# Patient Record
Sex: Female | Born: 1990 | Race: Black or African American | Hispanic: No | Marital: Single | Smoking: Former smoker
Health system: Southern US, Community
[De-identification: ages and names within clinical notes are randomized; demographics above are authoritative.]

## PROBLEM LIST (undated history)

## (undated) ENCOUNTER — Inpatient Hospital Stay (HOSPITAL_COMMUNITY): Payer: Self-pay

## (undated) DIAGNOSIS — Z789 Other specified health status: Secondary | ICD-10-CM

## (undated) HISTORY — PX: DILATION AND CURETTAGE OF UTERUS: SHX78

## (undated) HISTORY — PX: HERNIA REPAIR: SHX51

---

## 2016-03-31 NOTE — L&D Delivery Note (Signed)
Delivery Note At 10:57 PM a viable female was delivered via Vaginal, Spontaneous Delivery (Presentation: Cephalic;ROA  ).  APGAR: 9, 9; weight pending  Placenta status: intact   Cord:3 vessels  with the following complications: none.    Anesthesia:  none Episiotomy: None Lacerations: Left and right labial laceration hemostatic Suture Repair: None needed Est. Blood Loss (mL): 50  Mom to postpartum.  Baby to Couplet care / Skin to Skin.  Head delivered ROA. No nuchal cord present. Shoulder and body delivered in usual fashion. Infant with spontaneous cry, placed on mother's abdomen, dried and bulb suctioned. Cord clamped x 2 after 1-minute delay, and cut by family member. Cord blood drawn. Placenta delivered spontaneously with gentle cord traction. Fundus firm with massage and Pitocin. Perineum inspected and found to have left and right labial lacerations, which was found to be hemostatic.  Teodoro KilKerrriann S. Emmons Toth,  MD Family Medicine Resident PGY-1 01/21/17, 11:18 PM

## 2016-10-11 LAB — OB RESULTS CONSOLE HEPATITIS B SURFACE ANTIGEN: Hepatitis B Surface Ag: NEGATIVE

## 2016-10-11 LAB — OB RESULTS CONSOLE PLATELET COUNT: PLATELETS: 214

## 2016-10-11 LAB — OB RESULTS CONSOLE HGB/HCT, BLOOD
HCT: 34
Hemoglobin: 10.9

## 2016-10-11 LAB — OB RESULTS CONSOLE RPR: RPR: NONREACTIVE

## 2016-10-11 LAB — OB RESULTS CONSOLE RUBELLA ANTIBODY, IGM: Rubella: IMMUNE

## 2016-10-11 LAB — OB RESULTS CONSOLE TSH: TSH: 1.172

## 2016-11-06 ENCOUNTER — Encounter (HOSPITAL_COMMUNITY): Payer: Self-pay | Admitting: Emergency Medicine

## 2016-11-06 ENCOUNTER — Encounter (HOSPITAL_COMMUNITY): Payer: Self-pay | Admitting: *Deleted

## 2016-11-06 ENCOUNTER — Inpatient Hospital Stay (HOSPITAL_COMMUNITY)
Admission: AD | Admit: 2016-11-06 | Discharge: 2016-11-06 | Disposition: A | Discharge status: Home / Self Care | Payer: Self-pay | Source: Ambulatory Visit | Attending: Obstetrics & Gynecology | Admitting: Obstetrics & Gynecology

## 2016-11-06 ENCOUNTER — Emergency Department (HOSPITAL_COMMUNITY)
Admission: EM | Admit: 2016-11-06 | Discharge: 2016-11-06 | Discharge status: Against Medical Advice | Payer: Self-pay | Attending: Emergency Medicine | Admitting: Emergency Medicine

## 2016-11-06 DIAGNOSIS — Z3A29 29 weeks gestation of pregnancy: Secondary | ICD-10-CM | POA: Insufficient documentation

## 2016-11-06 DIAGNOSIS — Z87891 Personal history of nicotine dependence: Secondary | ICD-10-CM | POA: Insufficient documentation

## 2016-11-06 DIAGNOSIS — O9989 Other specified diseases and conditions complicating pregnancy, childbirth and the puerperium: Secondary | ICD-10-CM

## 2016-11-06 DIAGNOSIS — N898 Other specified noninflammatory disorders of vagina: Secondary | ICD-10-CM | POA: Insufficient documentation

## 2016-11-06 DIAGNOSIS — O26893 Other specified pregnancy related conditions, third trimester: Secondary | ICD-10-CM | POA: Insufficient documentation

## 2016-11-06 HISTORY — DX: Other specified health status: Z78.9

## 2016-11-06 LAB — URINALYSIS, ROUTINE W REFLEX MICROSCOPIC
BILIRUBIN URINE: NEGATIVE
Bacteria, UA: NONE SEEN
GLUCOSE, UA: NEGATIVE mg/dL
Hgb urine dipstick: NEGATIVE
KETONES UR: NEGATIVE mg/dL
Nitrite: NEGATIVE
PROTEIN: NEGATIVE mg/dL
Specific Gravity, Urine: 1.025 (ref 1.005–1.030)
pH: 6 (ref 5.0–8.0)

## 2016-11-06 LAB — WET PREP, GENITAL
CLUE CELLS WET PREP: NONE SEEN
Sperm: NONE SEEN
Trich, Wet Prep: NONE SEEN
Yeast Wet Prep HPF POC: NONE SEEN

## 2016-11-06 NOTE — MAU Note (Signed)
Urine in the lab  

## 2016-11-06 NOTE — MAU Note (Signed)
Pt reports having some vaginal swelling and "lumps" that started yesterday. C/O pelvic pain as well.[place] just arrived from [place] yesterday. Stated she was getting prenatal care there last visit was 4 weeks ago[facility]. Reports some white vaginal discharge as well.

## 2016-11-06 NOTE — Discharge Instructions (Signed)
[facility]  ° ° °[facility]       Phone: [phone] ° °[facility] Phone: [phone] ° °[facility]  Phone: [phone] ° °[facility]  Phone: [phone] ° °[facility]  Phone: [phone] ° °[facility]       Phone: [phone] ° °[facility]    Phone: [phone]  ° °[facility]    Phone: [phone] ° °[facility]    Phone: [phone] ° °[facility]    Phone: [phone] ° °[facility]    Phone: [phone] ° °[facility]       Phone: [phone]  ° °[facility]  Phone: [phone] ° °[facility]    Phone: [phone] ° °[facility]   Phone: [phone] ° °[facility]      Phone: [phone] ° °[facility]    Phone: [phone] ° °

## 2016-11-06 NOTE — Progress Notes (Addendum)
G6P5 @ 29.[redacted] wksga. Presents to triage for vaginal pain and swelling for 3 days. Pt moved to [place] yesterday from [place] and plans to stay in the area. Did have PNC. Denies LOF or bleeding. + FM. EFM applied. VSS see flow sheet for details.   All SVD with two preterm births. Pt states had d&c due to "lot of bleeding".   1540: Provider at bs assessing. Wet prep and GC done w pelvic exam  1647: Provider at bs discussing results of tests and going of POC.   Discharge instructions given with pt understanding. Pt left unit via ambulatory.

## 2016-11-06 NOTE — ED Triage Notes (Addendum)
Pt is [redacted] weeks pregnant. Pt reports white discharge for 3 days. PT also reports swelling and lumps to vagina that come and go. Pt also reports lower pelvic cramping. Pt has good fetal movement.  P-7 G-6 Pt is not taking prenatal vitamins. Pt [place] prenatal [place] in [place] but just moved here yesterday so she needs an OB.

## 2016-11-06 NOTE — MAU Provider Note (Signed)
History     CSN: [id number]  Arrival date and time: 11/06/16 1338   First Provider Initiated Contact with Patient 11/06/16 1500      Chief Complaint  Patient presents with  . Groin Swelling  . Vaginal Discharge    HPI: Heather Cohen is a 26 y.o. [phone] with IUP at 2547w2d who presents to maternity admissions reporting vaginal swelling/lumps for about 3 days. Reports noting some bumps on her labia, but no sores and no pain. Reports some lower abdominal pain, but denies contractions. Also notes that she has had a vaginal discharge.   Denies contractions, leakage of fluid or vaginal bleeding. Good fetal movement.  Also denies any  fevers, chills, sweats, dysuria, nausea, vomiting, other GI or GU symptoms or other general symptoms.  [place] [facility] [place] [facility] [place], a reports uncomplicated pregnancy. All previous pregnancies uncomplicated resulting in vaginal deliveries.   Past obstetric history: OB History  Gravida Para Term Preterm AB Living  6 5 3 2   5   SAB TAB Ectopic Multiple Live Births               # Outcome Date GA Lbr Len/2nd Weight Sex Delivery Anes PTL Lv  6 Current           5 Preterm           4 Preterm           3 Term           2 Term           1 Term               Past Medical History:  Diagnosis Date  . Complication of anesthesia   . Medical history non-contributory     Past Surgical History:  Procedure Laterality Date  . DILATION AND CURETTAGE OF UTERUS     2011  . HERNIA REPAIR      History reviewed. No pertinent family history.  Social History  Substance Use Topics  . Smoking status: Former Games developermoker  . Smokeless tobacco: Never Used  . Alcohol use No    Allergies: No Known Allergies  No prescriptions prior to admission.    Review of Systems - Negative except for what is mentioned in HPI.  Physical Exam   Blood pressure (!) 99/54, pulse 68, temperature 98.5 F (36.9 C), temperature source Oral, resp.  rate 18, height 5\' 6"  (1.676 m), weight 155 lb (70.3 kg).  Constitutional: Well-developed, well-nourished female in no acute distress.  Cardiovascular: normal rate, regular rythm Respiratory: normal effort, no respiratory distress  GI: Abd soft, non-tender, gravid appropriate for gestational age.   GU: Neg CVAT.  Pelvic: Mild swelling of left labia, without erythema, increase in warmth or loculated lesion; no ulcers; no TTP. Whiteish vaginal discharge, no blood, cervix clean. No CMT.  MSK: Extremities nontender, no edema, normal ROM Neurologic: Alert and oriented x 4. Psych: Normal mood and affect   FHT:  Baseline 145 , moderate variability, accelerations present, no decelerations Toco: no contractions  MAU Course  Procedures  MDM. Exam as above Wet prep and GC/Chlamydia sent. Wet pre neg for yeast, trich or clue cells.   Assessment and Plan  Assessment: 1. Vaginal discharge    Plan: --Discussed return precautions at length. --Discharge home in stable condition.  --PTL precautions --Given list of OB providers to establish care in the area ASAP.  [name], Kandra NicolasJulie P, MD 11/06/2016 3:52 PM

## 2016-11-10 LAB — GC/CHLAMYDIA PROBE AMP (~~LOC~~) NOT AT ARMC
CHLAMYDIA, DNA PROBE: NEGATIVE
NEISSERIA GONORRHEA: NEGATIVE

## 2016-12-18 ENCOUNTER — Inpatient Hospital Stay (HOSPITAL_COMMUNITY)
Admission: AD | Admit: 2016-12-18 | Discharge: 2016-12-18 | Disposition: A | Discharge status: Home / Self Care | Payer: Self-pay | Source: Ambulatory Visit | Attending: Obstetrics and Gynecology | Admitting: Obstetrics and Gynecology

## 2016-12-18 ENCOUNTER — Encounter (HOSPITAL_COMMUNITY): Payer: Self-pay

## 2016-12-18 DIAGNOSIS — Z87891 Personal history of nicotine dependence: Secondary | ICD-10-CM | POA: Insufficient documentation

## 2016-12-18 DIAGNOSIS — O4703 False labor before 37 completed weeks of gestation, third trimester: Secondary | ICD-10-CM

## 2016-12-18 DIAGNOSIS — Z3A35 35 weeks gestation of pregnancy: Secondary | ICD-10-CM | POA: Insufficient documentation

## 2016-12-18 MED ORDER — PRENATAL VITAMINS 0.8 MG PO TABS: 1.0000 | ORAL_TABLET | Freq: Every day | ORAL | 12 refills | Status: AC

## 2016-12-18 MED ORDER — LACTATED RINGERS IV BOLUS (SEPSIS)
1000.0000 mL | Freq: Once | INTRAVENOUS | Status: DC
Start: 2016-12-18 — End: 2016-12-18

## 2016-12-18 MED ORDER — LACTATED RINGERS IV BOLUS (SEPSIS)
1000.0000 mL | Freq: Once | INTRAVENOUS | Status: AC
  Administered 2016-12-18: 1000 mL via INTRAVENOUS

## 2016-12-18 MED ORDER — BETAMETHASONE SOD PHOS & ACET 6 (3-3) MG/ML IJ SUSP
12.0000 mg | INTRAMUSCULAR | Status: DC
  Administered 2016-12-18: 12 mg via INTRAMUSCULAR
  Filled 2016-12-18 (×2): qty 2

## 2016-12-18 NOTE — Discharge Instructions (Signed)
Return tomorrow around 12:30 to receive your second dose of steroid

## 2016-12-18 NOTE — MAU Provider Note (Signed)
  History     CSN: [id number]  Arrival date and time: 12/18/16 1020   None     Chief Complaint  Patient presents with  . Contractions   HPI 26 yo [id number] at [redacted]w[redacted]d here for the evaluation of contractions since 9:30am. Patient moved from [place] in late August and has not been able to establish care. She has a history of preterm delivery around 35-36 weeks. She reports uncomplicated prenatal care in [place]. She denies HTN or DM. She reports good fetal movement, she denies leakage of fluid or vaginal bleeding. Patient states that since her arrival to the MAU her contractions are not as intense as they were at home.   OB History    Gravida Para Term Preterm AB Living   SAB TAB Ectopic Multiple Live Births                  Past Medical History:  Diagnosis Date  . Medical history non-contributory     Past Surgical History:  Procedure Laterality Date  . DILATION AND CURETTAGE OF UTERUS     2011  . HERNIA REPAIR      History reviewed. No pertinent family history.  Social History  Substance Use Topics  . Smoking status: Former Games developer  . Smokeless tobacco: Never Used  . Alcohol use No    Allergies: No Known Allergies  No prescriptions prior to admission.    Review of Systems  See pertinent in HPI Physical Exam   Blood pressure 117/68, pulse 77, temperature 97.9 F (36.6 C), temperature source Oral, resp. rate 18.  Physical Exam GENERAL: Well-developed, well-nourished female in no acute distress.  ABDOMEN: Soft, nontender, gravid Cervix: 2.5/50/-3 EXTREMITIES: No cyanosis, clubbing, or edema, 2+ distal pulses.  FHT: baseline 140, mod variability, no accels, no decels Toco: ctx q 2 minutes  MAU Course  Procedures  MDM IV hydration Patient reports complete resolution of her contractions following IV hydration  Cervical exam remains unchanged FHT: baseline 140, mod variability, +accels, no decels Toco: no contractions  Assessment and Plan   26 yo [id number] at [redacted]w[redacted]d with preterm contractions - reassurance provided - advised patient to stay well hydrated - BMZ today and tomorrow - Will contact [facility] to schedule appointment to start prenatal care - Labor precautions reviewed  Noemy Hallmon 12/18/2016, 12:35 PM

## 2016-12-18 NOTE — MAU Note (Signed)
Started having contractions and hour ago. Pain was really bad and kept coming.

## 2016-12-26 ENCOUNTER — Encounter: Payer: Self-pay | Admitting: Obstetrics and Gynecology

## 2016-12-26 ENCOUNTER — Ambulatory Visit (INDEPENDENT_AMBULATORY_CARE_PROVIDER_SITE_OTHER): Payer: Self-pay | Admitting: Obstetrics and Gynecology

## 2016-12-26 VITALS — BP 107/67 | HR 61 | Wt 158.6 lb

## 2016-12-26 DIAGNOSIS — R8290 Unspecified abnormal findings in urine: Secondary | ICD-10-CM

## 2016-12-26 DIAGNOSIS — O09899 Supervision of other high risk pregnancies, unspecified trimester: Secondary | ICD-10-CM

## 2016-12-26 DIAGNOSIS — O09213 Supervision of pregnancy with history of pre-term labor, third trimester: Secondary | ICD-10-CM

## 2016-12-26 DIAGNOSIS — O0990 Supervision of high risk pregnancy, unspecified, unspecified trimester: Secondary | ICD-10-CM

## 2016-12-26 DIAGNOSIS — O0993 Supervision of high risk pregnancy, unspecified, third trimester: Secondary | ICD-10-CM

## 2016-12-26 DIAGNOSIS — Z113 Encounter for screening for infections with a predominantly sexual mode of transmission: Secondary | ICD-10-CM

## 2016-12-26 DIAGNOSIS — O09219 Supervision of pregnancy with history of pre-term labor, unspecified trimester: Secondary | ICD-10-CM

## 2016-12-26 DIAGNOSIS — Z331 Pregnant state, incidental: Secondary | ICD-10-CM

## 2016-12-26 LAB — POCT URINALYSIS DIP (DEVICE)
BILIRUBIN URINE: NEGATIVE
GLUCOSE, UA: NEGATIVE mg/dL
Hgb urine dipstick: NEGATIVE
KETONES UR: NEGATIVE mg/dL
Nitrite: POSITIVE — AB
Protein, ur: NEGATIVE mg/dL
SPECIFIC GRAVITY, URINE: 1.025 (ref 1.005–1.030)
Urobilinogen, UA: 1 mg/dL (ref 0.0–1.0)
pH: 6.5 (ref 5.0–8.0)

## 2016-12-26 NOTE — Patient Instructions (Signed)
Vaginal Delivery Vaginal delivery means that you will give birth by pushing your baby out of your birth canal (vagina). A team of health care providers will help you before, during, and after vaginal delivery. Birth experiences are unique for every woman and every pregnancy, and birth experiences vary depending on where you choose to give birth. What should I do to prepare for my baby's birth? Before your baby is born, it is important to talk with your health care provider about:  Your labor and delivery preferences. These may include: ? Medicines that you may be given. ? How you will manage your pain. This might include non-medical pain relief techniques or injectable pain relief such as epidural analgesia. ? How you and your baby will be monitored during labor and delivery. ? Who may be in the labor and delivery room with you. ? Your feelings about surgical delivery of your baby (cesarean delivery, or C-section) if this becomes necessary. ? Your feelings about receiving donated blood through an IV tube (blood transfusion) if this becomes necessary.  Whether you are able: ? To take pictures or videos of the birth. ? To eat during labor and delivery. ? To move around, walk, or change positions during labor and delivery.  What to expect after your baby is born, such as: ? Whether delayed umbilical cord clamping and cutting is offered. ? Who will care for your baby right after birth. ? Medicines or tests that may be recommended for your baby. ? Whether breastfeeding is supported in your hospital or birth center. ? How long you will be in the hospital or birth center.  How any medical conditions you have may affect your baby or your labor and delivery experience.  To prepare for your baby's birth, you should also:  Attend all of your health care visits before delivery (prenatal visits) as recommended by your health care provider. This is important.  Prepare your home for your baby's  arrival. Make sure that you have: ? Diapers. ? Baby clothing. ? Feeding equipment. ? Safe sleeping arrangements for you and your baby.  Install a car seat in your vehicle. Have your car seat checked by a certified car seat installer to make sure that it is installed safely.  Think about who will help you with your new baby at home for at least the first several weeks after delivery.  What can I expect when I arrive at the birth center or hospital? Once you are in labor and have been admitted into the hospital or birth center, your health care provider may:  Review your pregnancy history and any concerns you have.  Insert an IV tube into one of your veins. This is used to give you fluids and medicines.  Check your blood pressure, pulse, temperature, and heart rate (vital signs).  Check whether your bag of water (amniotic sac) has broken (ruptured).  Talk with you about your birth plan and discuss pain control options.  Monitoring Your health care provider may monitor your contractions (uterine monitoring) and your baby's heart rate (fetal monitoring). You may need to be monitored:  Often, but not continuously (intermittently).  All the time or for long periods at a time (continuously). Continuous monitoring may be needed if: ? You are taking certain medicines, such as medicine to relieve pain or make your contractions stronger. ? You have pregnancy or labor complications.  Monitoring may be done by:  Placing a special stethoscope or a handheld monitoring device on your abdomen to   check your baby's heartbeat, and feeling your abdomen for contractions. This method of monitoring does not continuously record your baby's heartbeat or your contractions.  Placing monitors on your abdomen (external monitors) to record your baby's heartbeat and the frequency and length of contractions. You may not have to wear external monitors all the time.  Placing monitors inside of your uterus  (internal monitors) to record your baby's heartbeat and the frequency, length, and strength of your contractions. ? Your health care provider may use internal monitors if he or she needs more information about the strength of your contractions or your baby's heart rate. ? Internal monitors are put in place by passing a thin, flexible wire through your vagina and into your uterus. Depending on the type of monitor, it may remain in your uterus or on your baby's head until birth. ? Your health care provider will discuss the benefits and risks of internal monitoring with you and will ask for your permission before inserting the monitors.  Telemetry. This is a type of continuous monitoring that can be done with external or internal monitors. Instead of having to stay in bed, you are able to move around during telemetry. Ask your health care provider if telemetry is an option for you.  Physical exam Your health care provider may perform a physical exam. This may include:  Checking whether your baby is positioned: ? With the head toward your vagina (head-down). This is most common. ? With the head toward the top of your uterus (head-up or breech). If your baby is in a breech position, your health care provider may try to turn your baby to a head-down position so you can deliver vaginally. If it does not seem that your baby can be born vaginally, your provider may recommend surgery to deliver your baby. In rare cases, you may be able to deliver vaginally if your baby is head-up (breech delivery). ? Lying sideways (transverse). Babies that are lying sideways cannot be delivered vaginally.  Checking your cervix to determine: ? Whether it is thinning out (effacing). ? Whether it is opening up (dilating). ? How low your baby has moved into your birth canal.  What are the three stages of labor and delivery?  Normal labor and delivery is divided into the following three stages: Stage 1  Stage 1 is the  longest stage of labor, and it can last for hours or days. Stage 1 includes: ? Early labor. This is when contractions may be irregular, or regular and mild. Generally, early labor contractions are more than 10 minutes apart. ? Active labor. This is when contractions get longer, more regular, more frequent, and more intense. ? The transition phase. This is when contractions happen very close together, are very intense, and may last longer than during any other part of labor.  Contractions generally feel mild, infrequent, and irregular at first. They get stronger, more frequent (about every 2-3 minutes), and more regular as you progress from early labor through active labor and transition.  Many women progress through stage 1 naturally, but you may need help to continue making progress. If this happens, your health care provider may talk with you about: ? Rupturing your amniotic sac if it has not ruptured yet. ? Giving you medicine to help make your contractions stronger and more frequent.  Stage 1 ends when your cervix is completely dilated to 4 inches (10 cm) and completely effaced. This happens at the end of the transition phase. Stage 2  Once   your cervix is completely effaced and dilated to 4 inches (10 cm), you may start to feel an urge to push. It is common for the body to naturally take a rest before feeling the urge to push, especially if you received an epidural or certain other pain medicines. This rest period may last for up to 1-2 hours, depending on your unique labor experience.  During stage 2, contractions are generally less painful, because pushing helps relieve contraction pain. Instead of contraction pain, you may feel stretching and burning pain, especially when the widest part of your baby's head passes through the vaginal opening (crowning).  Your health care provider will closely monitor your pushing progress and your baby's progress through the vagina during stage 2.  Your  health care provider may massage the area of skin between your vaginal opening and anus (perineum) or apply warm compresses to your perineum. This helps it stretch as the baby's head starts to crown, which can help prevent perineal tearing. ? In some cases, an incision may be made in your perineum (episiotomy) to allow the baby to pass through the vaginal opening. An episiotomy helps to make the opening of the vagina larger to allow more room for the baby to fit through.  It is very important to breathe and focus so your health care provider can control the delivery of your baby's head. Your health care provider may have you decrease the intensity of your pushing, to help prevent perineal tearing.  After delivery of your baby's head, the shoulders and the rest of the body generally deliver very quickly and without difficulty.  Once your baby is delivered, the umbilical cord may be cut right away, or this may be delayed for 1-2 minutes, depending on your baby's health. This may vary among health care providers, hospitals, and birth centers.  If you and your baby are healthy enough, your baby may be placed on your chest or abdomen to help maintain the baby's temperature and to help you bond with each other. Some mothers and babies start breastfeeding at this time. Your health care team will dry your baby and help keep your baby warm during this time.  Your baby may need immediate care if he or she: ? Showed signs of distress during labor. ? Has a medical condition. ? Was born too early (prematurely). ? Had a bowel movement before birth (meconium). ? Shows signs of difficulty transitioning from being inside the uterus to being outside of the uterus. If you are planning to breastfeed, your health care team will help you begin a feeding. Stage 3  The third stage of labor starts immediately after the birth of your baby and ends after you deliver the placenta. The placenta is an organ that develops  during pregnancy to provide oxygen and nutrients to your baby in the womb.  Delivering the placenta may require some pushing, and you may have mild contractions. Breastfeeding can stimulate contractions to help you deliver the placenta.  After the placenta is delivered, your uterus should tighten (contract) and become firm. This helps to stop bleeding in your uterus. To help your uterus contract and to control bleeding, your health care provider may: ? Give you medicine by injection, through an IV tube, by mouth, or through your rectum (rectally). ? Massage your abdomen or perform a vaginal exam to remove any blood clots that are left in your uterus. ? Empty your bladder by placing a thin, flexible tube (catheter) into your bladder. ? Encourage   you to breastfeed your baby. After labor is over, you and your baby will be monitored closely to ensure that you are both healthy until you are ready to go home. Your health care team will teach you how to care for yourself and your baby. This information is not intended to replace advice given to you by your health care provider. Make sure you discuss any questions you have with your health care provider. Document Released: 12/25/2007 Document Revised: 10/05/2015 Document Reviewed: 04/01/2015 Elsevier Interactive Patient Education  2018 [employer]  

## 2016-12-26 NOTE — Progress Notes (Signed)
Subjective:  Heather Cohen is a 26 y.o. [phone] at [redacted]w[redacted]d being seen today for ongoing prenatal care. She is moved to [facility] from [place] this past August. Has not had prenatal care since moving. Has had 2 visits in the MAU. Reports receiving prenatal care while in [place]  Reports prenatal has been unremarkable. She denies any chronic medical problems. H/O PTD at 35 and 36 weeks. She did receive 1 dose of BMZ on 9/20 d/t preterm contractions but did not return for second dose.  She is currently monitored for the following issues for this high-risk pregnancy and has Supervision of high risk pregnancy, antepartum and History of preterm delivery, currently pregnant on her problem list.  Patient reports occasional contractions.  Contractions: Irregular. Vag. Bleeding: None.  Movement: Present. Denies leaking of fluid.   The following portions of the patient's history were reviewed and updated as appropriate: allergies, current medications, past family history, past medical history, past social history, past surgical history and problem list. Problem list updated.  Objective:   Vitals:   12/26/16 1042  BP: 107/67  Pulse: 61  Weight: 158 lb 9.6 oz (71.9 kg)    Fetal Status:     Movement: Present     General:  Alert, oriented and cooperative. Patient is in no acute distress.  Skin: Skin is warm and dry. No rash noted.   Cardiovascular: Normal heart rate noted  Respiratory: Normal respiratory effort, no problems with respiration noted  Abdomen: Soft, gravid, appropriate for gestational age. Pain/Pressure: Present     Pelvic:  Cervical exam performed        Extremities: Normal range of motion.  Edema: None  Mental Status: Normal mood and affect. Normal behavior. Normal judgment and thought content.   Urinalysis: Urine Protein: Negative Urine Glucose: Negative  Assessment and Plan:  Pregnancy: [id number] at [redacted]w[redacted]d  1. History of preterm delivery, currently pregnant   2. Supervision of high risk  pregnancy, antepartum Will sign for release of medical records Labor precautions Plans Depo provera for contraception - [place] MFM OB COMP + 14 WK; Future - Strep Gp B Culture+Rflx - Cervicovaginal ancillary only  3. Abnormal finding in urine - Culture, OB Urine  Term labor symptoms and general obstetric precautions including but not limited to vaginal bleeding, contractions, leaking of fluid and fetal movement were reviewed in detail with the patient. Please refer to After Visit Summary for other counseling recommendations.  Return in about 1 week (around 01/02/2017) for OB visit.   Hermina Staggers, MD

## 2016-12-26 NOTE — Progress Notes (Signed)
Urine positive for nitrites ROI signed

## 2016-12-29 ENCOUNTER — Other Ambulatory Visit: Payer: Self-pay | Admitting: Obstetrics and Gynecology

## 2016-12-29 ENCOUNTER — Ambulatory Visit (HOSPITAL_COMMUNITY)
Admission: RE | Admit: 2016-12-29 | Discharge: 2016-12-29 | Disposition: A | Discharge status: Home / Self Care | Payer: Self-pay | Source: Ambulatory Visit | Attending: Obstetrics and Gynecology | Admitting: Obstetrics and Gynecology

## 2016-12-29 ENCOUNTER — Encounter: Payer: Self-pay | Admitting: Obstetrics and Gynecology

## 2016-12-29 DIAGNOSIS — O0990 Supervision of high risk pregnancy, unspecified, unspecified trimester: Secondary | ICD-10-CM

## 2016-12-29 DIAGNOSIS — Z3A36 36 weeks gestation of pregnancy: Secondary | ICD-10-CM

## 2016-12-29 DIAGNOSIS — Z363 Encounter for antenatal screening for malformations: Secondary | ICD-10-CM

## 2016-12-29 LAB — CULTURE, OB URINE

## 2016-12-29 LAB — CERVICOVAGINAL ANCILLARY ONLY
Chlamydia: NEGATIVE
Neisseria Gonorrhea: NEGATIVE

## 2016-12-29 LAB — URINE CULTURE, OB REFLEX

## 2016-12-29 NOTE — Progress Notes (Signed)
PM[facility] form complete

## 2016-12-30 ENCOUNTER — Encounter: Payer: Self-pay | Admitting: *Deleted

## 2016-12-31 LAB — STREP GP B CULTURE+RFLX: STREP GP B CULTURE+RFLX: NEGATIVE

## 2017-01-01 ENCOUNTER — Telehealth: Payer: Self-pay | Admitting: Pediatrics

## 2017-01-01 MED ORDER — AMPICILLIN 500 MG PO CAPS: 500.0000 mg | ORAL_CAPSULE | Freq: Two times a day (BID) | ORAL | 0 refills | Status: AC

## 2017-01-01 NOTE — Telephone Encounter (Signed)
-----   Message from Hermina Staggers, MD sent at 12/31/2016  9:21 AM EDT ----- Ampicillin 500 mg po bid x 7 days for UTI Thanks Casimiro Needle

## 2017-01-02 ENCOUNTER — Encounter: Payer: Self-pay | Admitting: Obstetrics and Gynecology

## 2017-01-05 LAB — OB RESULTS CONSOLE GBS: STREP GROUP B AG: NEGATIVE

## 2017-01-06 ENCOUNTER — Encounter: Payer: Self-pay | Admitting: Medical

## 2017-01-09 ENCOUNTER — Ambulatory Visit (INDEPENDENT_AMBULATORY_CARE_PROVIDER_SITE_OTHER): Payer: Self-pay | Admitting: Obstetrics & Gynecology

## 2017-01-09 ENCOUNTER — Encounter: Payer: Self-pay | Admitting: Obstetrics & Gynecology

## 2017-01-09 VITALS — BP 108/62 | HR 80 | Wt 161.0 lb

## 2017-01-09 DIAGNOSIS — O0990 Supervision of high risk pregnancy, unspecified, unspecified trimester: Secondary | ICD-10-CM

## 2017-01-09 NOTE — Progress Notes (Signed)
   PRENATAL VISIT NOTE  Subjective:  Heather Cohen is a 26 y.o. Z6X0960 at [redacted]w[redacted]d being seen today for ongoing prenatal care.  She is currently monitored for the following issues for this low-risk pregnancy and has Supervision of high risk pregnancy, antepartum and History of preterm delivery, currently pregnant on her problem list.  Patient reports occasional contractions.  Contractions: Irregular. Vag. Bleeding: None.  Movement: Present. Denies leaking of fluid.   The following portions of the patient's history were reviewed and updated as appropriate: allergies, current medications, past family history, past medical history, past social history, past surgical history and problem list. Problem list updated.  Objective:   Vitals:   01/09/17 0814  BP: 108/62  Pulse: 80  Weight: 161 lb (73 kg)    Fetal Status: Fetal Heart Rate (bpm): 128   Movement: Present     General:  Alert, oriented and cooperative. Patient is in no acute distress.  Skin: Skin is warm and dry. No rash noted.   Cardiovascular: Normal heart rate noted  Respiratory: Normal respiratory effort, no problems with respiration noted  Abdomen: Soft, gravid, appropriate for gestational age.  Pain/Pressure: Present     Pelvic: Cervical exam performed        Extremities: Normal range of motion.  Edema: None  Mental Status:  Normal mood and affect. Normal behavior. Normal judgment and thought content.   Assessment and Plan:  Pregnancy: A5W0981 at [redacted]w[redacted]d  There are no diagnoses linked to this encounter. Term labor symptoms and general obstetric precautions including but not limited to vaginal bleeding, contractions, leaking of fluid and fetal movement were reviewed in detail with the patient. Please refer to After Visit Summary for other counseling recommendations.  Return in about 1 week (around 01/16/2017).   Scheryl Darter, MD

## 2017-01-09 NOTE — Patient Instructions (Signed)
Vaginal Delivery Vaginal delivery means that you will give birth by pushing your baby out of your birth canal (vagina). A team of health care providers will help you before, during, and after vaginal delivery. Birth experiences are unique for every woman and every pregnancy, and birth experiences vary depending on where you choose to give birth. What should I do to prepare for my baby's birth? Before your baby is born, it is important to talk with your health care provider about:  Your labor and delivery preferences. These may include: ? Medicines that you may be given. ? How you will manage your pain. This might include non-medical pain relief techniques or injectable pain relief such as epidural analgesia. ? How you and your baby will be monitored during labor and delivery. ? Who may be in the labor and delivery room with you. ? Your feelings about surgical delivery of your baby (cesarean delivery, or C-section) if this becomes necessary. ? Your feelings about receiving donated blood through an IV tube (blood transfusion) if this becomes necessary.  Whether you are able: ? To take pictures or videos of the birth. ? To eat during labor and delivery. ? To move around, walk, or change positions during labor and delivery.  What to expect after your baby is born, such as: ? Whether delayed umbilical cord clamping and cutting is offered. ? Who will care for your baby right after birth. ? Medicines or tests that may be recommended for your baby. ? Whether breastfeeding is supported in your hospital or birth center. ? How long you will be in the hospital or birth center.  How any medical conditions you have may affect your baby or your labor and delivery experience.  To prepare for your baby's birth, you should also:  Attend all of your health care visits before delivery (prenatal visits) as recommended by your health care provider. This is important.  Prepare your home for your baby's  arrival. Make sure that you have: ? Diapers. ? Baby clothing. ? Feeding equipment. ? Safe sleeping arrangements for you and your baby.  Install a car seat in your vehicle. Have your car seat checked by a certified car seat installer to make sure that it is installed safely.  Think about who will help you with your new baby at home for at least the first several weeks after delivery.  What can I expect when I arrive at the birth center or hospital? Once you are in labor and have been admitted into the hospital or birth center, your health care provider may:  Review your pregnancy history and any concerns you have.  Insert an IV tube into one of your veins. This is used to give you fluids and medicines.  Check your blood pressure, pulse, temperature, and heart rate (vital signs).  Check whether your bag of water (amniotic sac) has broken (ruptured).  Talk with you about your birth plan and discuss pain control options.  Monitoring Your health care provider may monitor your contractions (uterine monitoring) and your baby's heart rate (fetal monitoring). You may need to be monitored:  Often, but not continuously (intermittently).  All the time or for long periods at a time (continuously). Continuous monitoring may be needed if: ? You are taking certain medicines, such as medicine to relieve pain or make your contractions stronger. ? You have pregnancy or labor complications.  Monitoring may be done by:  Placing a special stethoscope or a handheld monitoring device on your abdomen to   check your baby's heartbeat, and feeling your abdomen for contractions. This method of monitoring does not continuously record your baby's heartbeat or your contractions.  Placing monitors on your abdomen (external monitors) to record your baby's heartbeat and the frequency and length of contractions. You may not have to wear external monitors all the time.  Placing monitors inside of your uterus  (internal monitors) to record your baby's heartbeat and the frequency, length, and strength of your contractions. ? Your health care provider may use internal monitors if he or she needs more information about the strength of your contractions or your baby's heart rate. ? Internal monitors are put in place by passing a thin, flexible wire through your vagina and into your uterus. Depending on the type of monitor, it may remain in your uterus or on your baby's head until birth. ? Your health care provider will discuss the benefits and risks of internal monitoring with you and will ask for your permission before inserting the monitors.  Telemetry. This is a type of continuous monitoring that can be done with external or internal monitors. Instead of having to stay in bed, you are able to move around during telemetry. Ask your health care provider if telemetry is an option for you.  Physical exam Your health care provider may perform a physical exam. This may include:  Checking whether your baby is positioned: ? With the head toward your vagina (head-down). This is most common. ? With the head toward the top of your uterus (head-up or breech). If your baby is in a breech position, your health care provider may try to turn your baby to a head-down position so you can deliver vaginally. If it does not seem that your baby can be born vaginally, your provider may recommend surgery to deliver your baby. In rare cases, you may be able to deliver vaginally if your baby is head-up (breech delivery). ? Lying sideways (transverse). Babies that are lying sideways cannot be delivered vaginally.  Checking your cervix to determine: ? Whether it is thinning out (effacing). ? Whether it is opening up (dilating). ? How low your baby has moved into your birth canal.  What are the three stages of labor and delivery?  Normal labor and delivery is divided into the following three stages: Stage 1  Stage 1 is the  longest stage of labor, and it can last for hours or days. Stage 1 includes: ? Early labor. This is when contractions may be irregular, or regular and mild. Generally, early labor contractions are more than 10 minutes apart. ? Active labor. This is when contractions get longer, more regular, more frequent, and more intense. ? The transition phase. This is when contractions happen very close together, are very intense, and may last longer than during any other part of labor.  Contractions generally feel mild, infrequent, and irregular at first. They get stronger, more frequent (about every 2-3 minutes), and more regular as you progress from early labor through active labor and transition.  Many women progress through stage 1 naturally, but you may need help to continue making progress. If this happens, your health care provider may talk with you about: ? Rupturing your amniotic sac if it has not ruptured yet. ? Giving you medicine to help make your contractions stronger and more frequent.  Stage 1 ends when your cervix is completely dilated to 4 inches (10 cm) and completely effaced. This happens at the end of the transition phase. Stage 2  Once   your cervix is completely effaced and dilated to 4 inches (10 cm), you may start to feel an urge to push. It is common for the body to naturally take a rest before feeling the urge to push, especially if you received an epidural or certain other pain medicines. This rest period may last for up to 1-2 hours, depending on your unique labor experience.  During stage 2, contractions are generally less painful, because pushing helps relieve contraction pain. Instead of contraction pain, you may feel stretching and burning pain, especially when the widest part of your baby's head passes through the vaginal opening (crowning).  Your health care provider will closely monitor your pushing progress and your baby's progress through the vagina during stage 2.  Your  health care provider may massage the area of skin between your vaginal opening and anus (perineum) or apply warm compresses to your perineum. This helps it stretch as the baby's head starts to crown, which can help prevent perineal tearing. ? In some cases, an incision may be made in your perineum (episiotomy) to allow the baby to pass through the vaginal opening. An episiotomy helps to make the opening of the vagina larger to allow more room for the baby to fit through.  It is very important to breathe and focus so your health care provider can control the delivery of your baby's head. Your health care provider may have you decrease the intensity of your pushing, to help prevent perineal tearing.  After delivery of your baby's head, the shoulders and the rest of the body generally deliver very quickly and without difficulty.  Once your baby is delivered, the umbilical cord may be cut right away, or this may be delayed for 1-2 minutes, depending on your baby's health. This may vary among health care providers, hospitals, and birth centers.  If you and your baby are healthy enough, your baby may be placed on your chest or abdomen to help maintain the baby's temperature and to help you bond with each other. Some mothers and babies start breastfeeding at this time. Your health care team will dry your baby and help keep your baby warm during this time.  Your baby may need immediate care if he or she: ? Showed signs of distress during labor. ? Has a medical condition. ? Was born too early (prematurely). ? Had a bowel movement before birth (meconium). ? Shows signs of difficulty transitioning from being inside the uterus to being outside of the uterus. If you are planning to breastfeed, your health care team will help you begin a feeding. Stage 3  The third stage of labor starts immediately after the birth of your baby and ends after you deliver the placenta. The placenta is an organ that develops  during pregnancy to provide oxygen and nutrients to your baby in the womb.  Delivering the placenta may require some pushing, and you may have mild contractions. Breastfeeding can stimulate contractions to help you deliver the placenta.  After the placenta is delivered, your uterus should tighten (contract) and become firm. This helps to stop bleeding in your uterus. To help your uterus contract and to control bleeding, your health care provider may: ? Give you medicine by injection, through an IV tube, by mouth, or through your rectum (rectally). ? Massage your abdomen or perform a vaginal exam to remove any blood clots that are left in your uterus. ? Empty your bladder by placing a thin, flexible tube (catheter) into your bladder. ? Encourage   you to breastfeed your baby. After labor is over, you and your baby will be monitored closely to ensure that you are both healthy until you are ready to go home. Your health care team will teach you how to care for yourself and your baby. This information is not intended to replace advice given to you by your health care provider. Make sure you discuss any questions you have with your health care provider. Document Released: 12/25/2007 Document Revised: 10/05/2015 Document Reviewed: 04/01/2015 Elsevier Interactive Patient Education  2018 [employer]  

## 2017-01-16 ENCOUNTER — Ambulatory Visit (INDEPENDENT_AMBULATORY_CARE_PROVIDER_SITE_OTHER): Payer: Self-pay | Admitting: Medical

## 2017-01-16 VITALS — BP 106/66 | HR 71 | Wt 158.9 lb

## 2017-01-16 DIAGNOSIS — O09899 Supervision of other high risk pregnancies, unspecified trimester: Secondary | ICD-10-CM

## 2017-01-16 DIAGNOSIS — O0990 Supervision of high risk pregnancy, unspecified, unspecified trimester: Secondary | ICD-10-CM

## 2017-01-16 DIAGNOSIS — O0993 Supervision of high risk pregnancy, unspecified, third trimester: Secondary | ICD-10-CM

## 2017-01-16 DIAGNOSIS — O09213 Supervision of pregnancy with history of pre-term labor, third trimester: Secondary | ICD-10-CM

## 2017-01-16 DIAGNOSIS — O09219 Supervision of pregnancy with history of pre-term labor, unspecified trimester: Secondary | ICD-10-CM

## 2017-01-16 DIAGNOSIS — O0933 Supervision of pregnancy with insufficient antenatal care, third trimester: Secondary | ICD-10-CM

## 2017-01-16 LAB — OB RESULTS CONSOLE HIV ANTIBODY (ROUTINE TESTING): HIV: NONREACTIVE

## 2017-01-16 NOTE — Progress Notes (Signed)
   PRENATAL VISIT NOTE  Subjective:  Heather Cohen is a 26 y.o. [id number] at 2967w3d being seen today for ongoing prenatal care.  She is currently monitored for the following issues for this high-risk pregnancy and has Supervision of high risk pregnancy, antepartum and History of preterm delivery, currently pregnant on her problem list.  Patient reports no complaints.  Contractions: Irregular. Vag. Bleeding: None.  Movement: Present. Denies leaking of fluid.   The following portions of the patient's history were reviewed and updated as appropriate: allergies, current medications, past family history, past medical history, past social history, past surgical history and problem list. Problem list updated.  Objective:   Vitals:   01/16/17 0806  BP: 106/66  Pulse: 71  Weight: 158 lb 14.4 oz (72.1 kg)    Fetal Status: Fetal Heart Rate (bpm): 134 Fundal Height: 38 cm Movement: Present     General:  Alert, oriented and cooperative. Patient is in no acute distress.  Skin: Skin is warm and dry. No rash noted.   Cardiovascular: Normal heart rate noted  Respiratory: Normal respiratory effort, no problems with respiration noted  Abdomen: Soft, gravid, appropriate for gestational age.  Pain/Pressure: Present     Pelvic: Cervical exam deferred Dilation: 4 Effacement (%): 80 Station: 0  Extremities: Normal range of motion.  Edema: None  Mental Status:  Normal mood and affect. Normal behavior. Normal judgment and thought content.   Assessment and Plan:  Pregnancy: A5W0981G6P2305 at 9067w3d  1. Supervision of high risk pregnancy, antepartum -ROI resent - Obstetric Panel, Including HIV  2. History of preterm delivery, currently pregnant  3. Insufficient prenatal care, third trimester -ROI resent - Obstetric Panel, Including HIV   Term labor symptoms and general obstetric precautions including but not limited to vaginal bleeding, contractions, leaking of fluid and fetal movement were reviewed in detail  with the patient. Please refer to After Visit Summary for other counseling recommendations.  Return in about 1 week (around 01/23/2017) for LOB with NST.   Rolm BookbinderCaroline M Neill, CNM 01/16/17 8:45 AM

## 2017-01-16 NOTE — Progress Notes (Signed)
Addendum:Release for records sent to Dr. Karie MainlandAli in Department of gynecology in [place].

## 2017-01-16 NOTE — Patient Instructions (Signed)
Fetal Movement Counts °Patient Name: ________________________________________________ Patient Due Date: ____________________ °What is a fetal movement count? °A fetal movement count is the number of times that you feel your baby move during a certain amount of time. This may also be called a fetal kick count. A fetal movement count is recommended for every pregnant woman. You may be asked to start counting fetal movements as early as week 28 of your pregnancy. °Pay attention to when your baby is most active. You may notice your baby's sleep and wake cycles. You may also notice things that make your baby move more. You should do a fetal movement count: °· When your baby is normally most active. °· At the same time each day. ° °A good time to count movements is while you are resting, after having something to eat and drink. °How do I count fetal movements? °1. Find a quiet, comfortable area. Sit, or lie down on your side. °2. Write down the date, the start time and stop time, and the number of movements that you felt between those two times. Take this information with you to your health care visits. °3. For 2 hours, count kicks, flutters, swishes, rolls, and jabs. You should feel at least 10 movements during 2 hours. °4. You may stop counting after you have felt 10 movements. °5. If you do not feel 10 movements in 2 hours, have something to eat and drink. Then, keep resting and counting for 1 hour. If you feel at least 4 movements during that hour, you may stop counting. °Contact a health care provider if: °· You feel fewer than 4 movements in 2 hours. °· Your baby is not moving like he or she usually does. °Date: ____________ Start time: ____________ Stop time: ____________ Movements: ____________ °Date: ____________ Start time: ____________ Stop time: ____________ Movements: ____________ °Date: ____________ Start time: ____________ Stop time: ____________ Movements: ____________ °Date: ____________ Start time:  ____________ Stop time: ____________ Movements: ____________ °Date: ____________ Start time: ____________ Stop time: ____________ Movements: ____________ °Date: ____________ Start time: ____________ Stop time: ____________ Movements: ____________ °Date: ____________ Start time: ____________ Stop time: ____________ Movements: ____________ °Date: ____________ Start time: ____________ Stop time: ____________ Movements: ____________ °Date: ____________ Start time: ____________ Stop time: ____________ Movements: ____________ °This information is not intended to replace advice given to you by your health care provider. Make sure you discuss any questions you have with your health care provider. °Document Released: 04/16/2006 Document Revised: 11/14/2015 Document Reviewed: 04/26/2015 °Elsevier Interactive Patient Education © 2018 [employer] °Braxton Hicks Contractions °Contractions of the uterus can occur throughout pregnancy, but they are not always a sign that you are in labor. You may have practice contractions called Braxton Hicks contractions. These false labor contractions are sometimes confused with true labor. °What are Braxton Hicks contractions? °Braxton Hicks contractions are tightening movements that occur in the muscles of the uterus before labor. Unlike true labor contractions, these contractions do not result in opening (dilation) and thinning of the cervix. Toward the end of pregnancy (32-34 weeks), Braxton Hicks contractions can happen more often and may become stronger. These contractions are sometimes difficult to tell apart from true labor because they can be very uncomfortable. You should not feel embarrassed if you go to the hospital with false labor. °Sometimes, the only way to tell if you are in true labor is for your health care provider to look for changes in the cervix. The health care provider will do a physical exam and may monitor your contractions. If   you are not in true labor, the exam  should show that your cervix is not dilating and your water has not broken. °If there are no prenatal problems or other health problems associated with your pregnancy, it is completely safe for you to be sent home with false labor. You may continue to have Braxton Hicks contractions until you go into true labor. °How can I tell the difference between true labor and false labor? °· Differences °? False labor °? Contractions last 30-70 seconds.: Contractions are usually shorter and not as strong as true labor contractions. °? Contractions become very regular.: Contractions are usually irregular. °? Discomfort is usually felt in the top of the uterus, and it spreads to the lower abdomen and low back.: Contractions are often felt in the front of the lower abdomen and in the groin. °? Contractions do not go away with walking.: Contractions may go away when you walk around or change positions while lying down. °? Contractions usually become more intense and increase in frequency.: Contractions get weaker and are shorter-lasting as time goes on. °? The cervix dilates and gets thinner.: The cervix usually does not dilate or become thin. °Follow these instructions at home: °· Take over-the-counter and prescription medicines only as told by your health care provider. °· Keep up with your usual exercises and follow other instructions from your health care provider. °· Eat and drink lightly if you think you are going into labor. °· If Braxton Hicks contractions are making you uncomfortable: °? Change your position from lying down or resting to walking, or change from walking to resting. °? Sit and rest in a tub of warm water. °? Drink enough fluid to keep your urine clear or pale yellow. Dehydration may cause these contractions. °? Do slow and deep breathing several times an hour. °· Keep all follow-up prenatal visits as told by your health care provider. This is important. °Contact a health care provider if: °· You have a  fever. °· You have continuous pain in your abdomen. °Get help right away if: °· Your contractions become stronger, more regular, and closer together. °· You have fluid leaking or gushing from your vagina. °· You pass blood-tinged mucus (bloody show). °· You have bleeding from your vagina. °· You have low back pain that you never had before. °· You feel your baby’s head pushing down and causing pelvic pressure. °· Your baby is not moving inside you as much as it used to. °Summary °· Contractions that occur before labor are called Braxton Hicks contractions, false labor, or practice contractions. °· Braxton Hicks contractions are usually shorter, weaker, farther apart, and less regular than true labor contractions. True labor contractions usually become progressively stronger and regular and they become more frequent. °· Manage discomfort from Braxton Hicks contractions by changing position, resting in a warm bath, drinking plenty of water, or practicing deep breathing. °This information is not intended to replace advice given to you by your health care provider. Make sure you discuss any questions you have with your health care provider. °Document Released: 03/17/2005 Document Revised: 02/04/2016 Document Reviewed: 02/04/2016 °Elsevier Interactive Patient Education © 2017 Elsevier Inc. ° °

## 2017-01-17 LAB — OBSTETRIC PANEL, INCLUDING HIV
Antibody Screen: NEGATIVE
BASOS ABS: 0 10*3/uL (ref 0.0–0.2)
Basos: 0 %
EOS (ABSOLUTE): 0.1 10*3/uL (ref 0.0–0.4)
Eos: 2 %
HEP B S AG: NEGATIVE
HIV SCREEN 4TH GENERATION: NONREACTIVE
Hematocrit: 34.7 % (ref 34.0–46.6)
Hemoglobin: 11.1 g/dL (ref 11.1–15.9)
Immature Grans (Abs): 0 10*3/uL (ref 0.0–0.1)
Immature Granulocytes: 0 %
LYMPHS ABS: 1.3 10*3/uL (ref 0.7–3.1)
Lymphs: 26 %
MCH: 26.3 pg — AB (ref 26.6–33.0)
MCHC: 32 g/dL (ref 31.5–35.7)
MCV: 82 fL (ref 79–97)
MONOS ABS: 0.7 10*3/uL (ref 0.1–0.9)
Monocytes: 14 %
NEUTROS ABS: 2.9 10*3/uL (ref 1.4–7.0)
Neutrophils: 58 %
PLATELETS: 253 10*3/uL (ref 150–379)
RBC: 4.22 x10E6/uL (ref 3.77–5.28)
RDW: 14 % (ref 12.3–15.4)
RPR: NONREACTIVE
Rh Factor: POSITIVE
Rubella Antibodies, IGG: 2.35 index (ref >0.99)
WBC: 5.1 10*3/uL (ref 3.4–10.8)

## 2017-01-19 ENCOUNTER — Encounter (HOSPITAL_COMMUNITY): Payer: Self-pay | Admitting: *Deleted

## 2017-01-19 ENCOUNTER — Telehealth (HOSPITAL_COMMUNITY): Payer: Self-pay | Admitting: *Deleted

## 2017-01-19 ENCOUNTER — Inpatient Hospital Stay (HOSPITAL_COMMUNITY)
Admission: AD | Admit: 2017-01-19 | Discharge: 2017-01-19 | Disposition: A | Discharge status: Home / Self Care | Payer: Self-pay | Source: Ambulatory Visit | Attending: Obstetrics and Gynecology | Admitting: Obstetrics and Gynecology

## 2017-01-19 DIAGNOSIS — O471 False labor at or after 37 completed weeks of gestation: Secondary | ICD-10-CM

## 2017-01-19 DIAGNOSIS — O368130 Decreased fetal movements, third trimester, not applicable or unspecified: Secondary | ICD-10-CM | POA: Insufficient documentation

## 2017-01-19 DIAGNOSIS — Z3689 Encounter for other specified antenatal screening: Secondary | ICD-10-CM

## 2017-01-19 DIAGNOSIS — Z87891 Personal history of nicotine dependence: Secondary | ICD-10-CM | POA: Insufficient documentation

## 2017-01-19 DIAGNOSIS — Z3A39 39 weeks gestation of pregnancy: Secondary | ICD-10-CM | POA: Insufficient documentation

## 2017-01-19 NOTE — MAU Note (Signed)
Urine in lab 

## 2017-01-19 NOTE — MAU Provider Note (Signed)
  History     CSN: [id number]  Arrival date and time: 01/19/17 [id number]   First Provider Initiated Contact with Patient 01/19/17 [phone]      Chief Complaint  Patient presents with  . Decreased Fetal Movement  . Contractions   HPI  Heather Cohen is a 26 y.o. [id number] at 5796w6d gestation presenting to MAU with complaints of decreased fetal movement for 24 hours.  Heather Cohen reports that Heather Cohen "laid down all day yesterday, but does not remember the baby moving". Heather Cohen states, "Last night I tried to get her to move and I couldn't get her to move.  I tried to get her to move this morning, but I still couldn't feel her move". Heather Cohen reports feeling good movement now since being on the monitor. Heather Cohen reports feeling contractions, but none of them are painful. Heather Cohen denies VB or LOF.  Past Medical History:  Diagnosis Date  . Medical history non-contributory     Past Surgical History:  Procedure Laterality Date  . DILATION AND CURETTAGE OF UTERUS     2011  . HERNIA REPAIR      History reviewed. No pertinent family history.  Social History  Substance Use Topics  . Smoking status: Former Games developermoker  . Smokeless tobacco: Never Used  . Alcohol use No    Allergies: No Known Allergies  Prescriptions Prior to Admission  Medication Sig Dispense Refill Last Dose  . Prenatal Multivit-Min-Fe-FA (PRENATAL VITAMINS) 0.8 MG tablet Take 1 tablet by mouth daily. 30 tablet 12 Taking    Review of Systems  Constitutional: Negative.   HENT: Negative.   Eyes: Negative.   Respiratory: Negative.   Cardiovascular: Negative.   Gastrointestinal: Positive for abdominal pain (Some mild UC's).  Endocrine: Negative.   Genitourinary: Negative.   Musculoskeletal: Negative.   Skin: Negative.   Allergic/Immunologic: Negative.   Neurological: Negative.   Hematological: Negative.   Psychiatric/Behavioral: Negative.    Physical Exam   Blood pressure 113/72, pulse 79, temperature 97.9 F (36.6 C), temperature source Oral,  resp. rate 18, last menstrual period 04/14/2016, SpO2 100 %.  Physical Exam  Nursing note and vitals reviewed. Constitutional: Heather Cohen is oriented to person, place, and time. Heather Cohen appears well-developed and well-nourished.  HENT:  Head: Normocephalic.  Eyes: Pupils are equal, round, and reactive to light.  Neck: Normal range of motion.  GI: Soft.  Musculoskeletal: Normal range of motion.  Neurological: Heather Cohen is alert and oriented to person, place, and time.  Skin: Skin is warm and dry.  Psychiatric: Heather Cohen has a normal mood and affect. Her behavior is normal. Judgment and thought content normal.    MAU Course  Procedures  MDM NST - FHR: 125 bpm / moderate variability / accels present / decels absent / TOCO: regular every 5 mins  Assessment and Plan  NST (non-stress test) reactive - Reassurance given that fetal well-being is good as evidenced by reactive NST - Instructions on False Labor & FKC provided - Advised to schedule an OB appt for 10/26 with [facility] - Return to MAU for OB emergencies  Discharge home Patient verbalized an understanding of the plan of care and agrees.   Raelyn Moraolitta Arantxa Piercey, MSN, CNM 01/19/2017, 8:50 AM

## 2017-01-19 NOTE — Telephone Encounter (Signed)
Preadmission screen  

## 2017-01-19 NOTE — MAU Note (Signed)
Pt presents via EMS with c/o decreased FM.  Pt also reports having ctxs, but they aren't painful.  Denies VB or LOF.

## 2017-01-21 ENCOUNTER — Encounter: Payer: Self-pay | Admitting: *Deleted

## 2017-01-21 ENCOUNTER — Encounter (HOSPITAL_COMMUNITY): Payer: Self-pay | Admitting: *Deleted

## 2017-01-21 ENCOUNTER — Inpatient Hospital Stay (HOSPITAL_COMMUNITY)
Admission: AD | Admit: 2017-01-21 | Discharge: 2017-01-23 | DRG: 807 | Disposition: A | Discharge status: Home / Self Care | Payer: Self-pay | Source: Ambulatory Visit | Attending: Obstetrics and Gynecology | Admitting: Obstetrics and Gynecology

## 2017-01-21 DIAGNOSIS — Z3A40 40 weeks gestation of pregnancy: Secondary | ICD-10-CM

## 2017-01-21 DIAGNOSIS — Z87891 Personal history of nicotine dependence: Secondary | ICD-10-CM

## 2017-01-21 LAB — TYPE AND SCREEN
ABO/RH(D): A POS
Antibody Screen: NEGATIVE

## 2017-01-21 LAB — CBC
HCT: 32.8 % — ABNORMAL LOW (ref 36.0–46.0)
HEMOGLOBIN: 10.8 g/dL — AB (ref 12.0–15.0)
MCH: 27.5 pg (ref 26.0–34.0)
MCHC: 32.9 g/dL (ref 30.0–36.0)
MCV: 83.5 fL (ref 78.0–100.0)
PLATELETS: 217 10*3/uL (ref 150–400)
RBC: 3.93 MIL/uL (ref 3.87–5.11)
RDW: 13.5 % (ref 11.5–15.5)
WBC: 7.5 10*3/uL (ref 4.0–10.5)

## 2017-01-21 LAB — POCT FERN TEST: POCT FERN TEST: POSITIVE

## 2017-01-21 LAB — T4, FREE: T4,Free (Direct): 0.8

## 2017-01-21 LAB — ABO/RH: ABO/RH(D): A POS

## 2017-01-21 LAB — GLUCOSE, 1 HOUR: GLUCOSE, 1 HOUR-GESTATIONAL: 100

## 2017-01-21 LAB — GLUCOSE, FASTING GESTATIONAL: Glucose Tolerance, Fasting: 82

## 2017-01-21 LAB — GLUCOSE, 2 HOUR GESTATIONAL: Glucose Tolerance, 2 hour: 70

## 2017-01-21 MED ORDER — SOD CITRATE-CITRIC ACID 500-334 MG/5ML PO SOLN: 30.0000 mL | ORAL | Status: DC | PRN

## 2017-01-21 MED ORDER — IBUPROFEN 600 MG PO TABS
600.0000 mg | ORAL_TABLET | Freq: Four times a day (QID) | ORAL | Status: DC
  Administered 2017-01-21 – 2017-01-23 (×6): 600 mg via ORAL
  Filled 2017-01-21 (×6): qty 1

## 2017-01-21 MED ORDER — OXYCODONE-ACETAMINOPHEN 5-325 MG PO TABS: 1.0000 | ORAL_TABLET | ORAL | Status: DC | PRN

## 2017-01-21 MED ORDER — ACETAMINOPHEN 325 MG PO TABS: 650.0000 mg | ORAL_TABLET | ORAL | Status: DC | PRN

## 2017-01-21 MED ORDER — FENTANYL CITRATE (PF) 100 MCG/2ML IJ SOLN
50.0000 ug | INTRAMUSCULAR | Status: DC | PRN
  Filled 2017-01-21: qty 2

## 2017-01-21 MED ORDER — ONDANSETRON HCL 4 MG/2ML IJ SOLN: 4.0000 mg | Freq: Four times a day (QID) | INTRAMUSCULAR | Status: DC | PRN

## 2017-01-21 MED ORDER — LIDOCAINE HCL (PF) 1 % IJ SOLN
30.0000 mL | INTRAMUSCULAR | Status: DC | PRN
  Filled 2017-01-21: qty 30

## 2017-01-21 MED ORDER — OXYTOCIN 40 UNITS IN LACTATED RINGERS INFUSION - SIMPLE MED
2.5000 [IU]/h | INTRAVENOUS | Status: DC
  Filled 2017-01-21: qty 1000

## 2017-01-21 MED ORDER — LACTATED RINGERS IV SOLN
INTRAVENOUS | Status: DC
  Administered 2017-01-21: 15:00:00 via INTRAVENOUS

## 2017-01-21 MED ORDER — TERBUTALINE SULFATE 1 MG/ML IJ SOLN
0.2500 mg | Freq: Once | INTRAMUSCULAR | Status: DC | PRN
  Filled 2017-01-21: qty 1

## 2017-01-21 MED ORDER — PRENATAL MULTIVITAMIN CH: 1.0000 | ORAL_TABLET | Freq: Every day | ORAL | Status: DC

## 2017-01-21 MED ORDER — OXYTOCIN 40 UNITS IN LACTATED RINGERS INFUSION - SIMPLE MED
1.0000 m[IU]/min | INTRAVENOUS | Status: DC
  Administered 2017-01-21: 2 m[IU]/min via INTRAVENOUS

## 2017-01-21 MED ORDER — OXYTOCIN BOLUS FROM INFUSION
500.0000 mL | Freq: Once | INTRAVENOUS | Status: AC
  Administered 2017-01-21: 500 mL via INTRAVENOUS

## 2017-01-21 MED ORDER — PRENATAL VITAMINS 0.8 MG PO TABS: 1.0000 | ORAL_TABLET | Freq: Every day | ORAL | Status: DC

## 2017-01-21 MED ORDER — OXYCODONE-ACETAMINOPHEN 5-325 MG PO TABS: 2.0000 | ORAL_TABLET | ORAL | Status: DC | PRN

## 2017-01-21 MED ORDER — LACTATED RINGERS IV SOLN
500.0000 mL | INTRAVENOUS | Status: DC | PRN
Start: 2017-01-21 — End: 2017-01-22

## 2017-01-21 NOTE — H&P (Signed)
LABOR AND DELIVERY ADMISSION HISTORY AND PHYSICAL NOTE  Heather Cohen is a 26 y.o. female [id number] with IUP at [redacted]w[redacted]d by LMP presenting for SROM.  She reports positive fetal movement. She denies vaginal bleeding. She reports leakage of fluid about 2:30pm today.  She denies fever, recent sickness, headache, abdominal pain, nausea, vomiting, urinary pain or burning, constipation, or diarrhea.  Prenatal History/Complications: [facility] at [facility] Pregnancy complications:  - none  Past Medical History: Past Medical History:  Diagnosis Date  . Medical history non-contributory     Past Surgical History: Past Surgical History:  Procedure Laterality Date  . DILATION AND CURETTAGE OF UTERUS     2011  . HERNIA REPAIR      Obstetrical History: OB History    Gravida Para Term Preterm AB Living   6 5 3 2   5    SAB TAB Ectopic Multiple Live Births           5      Social History: Social History   Social History  . Marital status: Single    Spouse name: N/A  . Number of children: N/A  . Years of education: N/A   Social History Main Topics  . Smoking status: Former Games developer  . Smokeless tobacco: Never Used  . Alcohol use No  . Drug use: No  . Sexual activity: Yes    Birth control/ protection: None   Other Topics Concern  . None   Social History Narrative  . None    Family History: History reviewed. No pertinent family history.  Allergies: No Known Allergies  Prescriptions Prior to Admission  Medication Sig Dispense Refill Last Dose  . Prenatal Multivit-Min-Fe-FA (PRENATAL VITAMINS) 0.8 MG tablet Take 1 tablet by mouth daily. 30 tablet 12 Taking     Review of Systems  All systems reviewed and negative except as stated in HPI  Physical Exam Blood pressure 110/68, pulse 73, temperature (!) 97.3 F (36.3 C), temperature source Oral, resp. rate 18, last menstrual period 04/14/2016. General appearance: alert, cooperative and no distress Lungs: clear to  auscultation bilaterally Heart: regular rate and rhythm Abdomen: soft, non-tender; bowel sounds normal Extremities: No calf swelling or tenderness Presentation: cephalic Fetal monitoring: 135bpm, moderate variability, accelerations present, no decelerations Uterine activity: irregular Dilation: 5 Effacement (%): 80 Station: -2 Exam by:: Dorrene German RN  Prenatal labs: ABO, Rh: A/Positive/-- (10/19 0943) Antibody: Negative (10/19 0943) Rubella: 2.35 (10/19 0943) RPR: Non Reactive (10/19 0943)  HBsAg: Negative (10/19 0943)  HIV:   negative GC/Chlamydia: negative on 12/26/2016 GBS:   negative 1 hr Glucola: normal (on 08/2016) Genetic screening:  none Anatomy US: normal  Prenatal Transfer Tool  Maternal Diabetes: No Genetic Screening: Declined Maternal Ultrasounds/Referrals: Normal Fetal Ultrasounds or other Referrals:  None Maternal Substance Abuse:  No Significant Maternal Medications:  None Significant Maternal Lab Results: None  Results for orders placed or performed during the hospital encounter of 01/21/17 (from the past 24 hour(s))  POCT fern test   Collection Time: 01/21/17  2:47 PM  Result Value Ref Range   POCT Fern Test Positive = ruptured amniotic membanes     Patient Active Problem List   Diagnosis Date Noted  . Normal labor 01/21/2017  . NST (non-stress test) reactive 01/19/2017  . Supervision of high risk pregnancy, antepartum 12/26/2016  . History of preterm delivery, currently pregnant 12/26/2016    Assessment: Heather Cohen is a 26 y.o. [id number] at [redacted]w[redacted]d here for SOL with ROM.  #Labor: SOL with  ROM #Pain: Epidural #FWB: Cat 1 #ID:  GBS negative #MOF: Formula #MOC: Depo Provera #Circ:  N/a (girl)  Arlyce Harmanimothy Lockamy, DO PGY-1, [facility] 01/21/2017, 3:26 PM  The patient was seen and examined by me also Agree with note NST reactive and reassuring UCs as listed Cervical exams as listed in note Wants to wait for FOB to arrive before  augmenting labor History of fast labor One delivery at home Aviva SignsWilliams, Reiner Loewen L, CNM

## 2017-01-21 NOTE — Progress Notes (Signed)
Labor Progress Note Heather Cohen is a 26 y.o. [id number] at 6659w1d presented for SROM @ 1430 (~7hrs) S: Pt states that she feel ok, continues to feel lower abdominal pressure.  O:  BP 98/64   Pulse 65   Temp 98.1 F (36.7 C) (Oral)   Resp 16   Ht 5\' 6"  (1.676 m)   Wt 158 lb (71.7 kg)   LMP 04/14/2016 (Approximate)   BMI 25.50 kg/m  EFM: 120/mod vari/+accels  CVE: Dilation: 5 Effacement (%): 70 Cervical Position: Middle Station: 0 Presentation: Vertex Exam by:: Devra DoppAmber Knox, RN   A&P: 26 y.o. A5W0981G6P3205 5159w1d here for SROM @ 1430 (~7hrs) #Labor: Progressing well. #Pain: plan for epidural  #FWB: cat 1 #GBS negative   Suella BroadKeriann S Tjuana Vickrey, MD 9:31 PM

## 2017-01-21 NOTE — MAU Note (Signed)
Pt states she has been leaking fluid since around an hour ago, denies bleeding or uc's.  Reports good FM.

## 2017-01-22 ENCOUNTER — Encounter (HOSPITAL_COMMUNITY): Payer: Self-pay | Admitting: *Deleted

## 2017-01-22 LAB — RPR: RPR Ser Ql: NONREACTIVE

## 2017-01-22 MED ORDER — SIMETHICONE 80 MG PO CHEW: 80.0000 mg | CHEWABLE_TABLET | ORAL | Status: DC | PRN

## 2017-01-22 MED ORDER — DIBUCAINE 1 % RE OINT: 1.0000 "application " | TOPICAL_OINTMENT | RECTAL | Status: DC | PRN

## 2017-01-22 MED ORDER — WITCH HAZEL-GLYCERIN EX PADS: 1.0000 "application " | MEDICATED_PAD | CUTANEOUS | Status: DC | PRN

## 2017-01-22 MED ORDER — SENNOSIDES-DOCUSATE SODIUM 8.6-50 MG PO TABS
2.0000 | ORAL_TABLET | ORAL | Status: DC
  Administered 2017-01-22 (×2): 2 via ORAL
  Filled 2017-01-22 (×2): qty 2

## 2017-01-22 MED ORDER — PRENATAL MULTIVITAMIN CH
1.0000 | ORAL_TABLET | Freq: Every day | ORAL | Status: DC
  Administered 2017-01-22: 1 via ORAL
  Filled 2017-01-22: qty 1

## 2017-01-22 MED ORDER — TETANUS-DIPHTH-ACELL PERTUSSIS 5-2.5-18.5 LF-MCG/0.5 IM SUSP: 0.5000 mL | Freq: Once | INTRAMUSCULAR | Status: DC

## 2017-01-22 MED ORDER — ONDANSETRON HCL 4 MG PO TABS
4.0000 mg | ORAL_TABLET | ORAL | Status: DC | PRN
Start: 2017-01-22 — End: 2017-01-23

## 2017-01-22 MED ORDER — COCONUT OIL OIL: 1.0000 "application " | TOPICAL_OIL | Status: DC | PRN

## 2017-01-22 MED ORDER — ONDANSETRON HCL 4 MG/2ML IJ SOLN: 4.0000 mg | INTRAMUSCULAR | Status: DC | PRN

## 2017-01-22 MED ORDER — ACETAMINOPHEN 325 MG PO TABS
650.0000 mg | ORAL_TABLET | ORAL | Status: DC | PRN
  Administered 2017-01-22 – 2017-01-23 (×2): 650 mg via ORAL
  Filled 2017-01-22 (×2): qty 2

## 2017-01-22 MED ORDER — ZOLPIDEM TARTRATE 5 MG PO TABS
5.0000 mg | ORAL_TABLET | Freq: Every evening | ORAL | Status: DC | PRN
Start: 2017-01-22 — End: 2017-01-23

## 2017-01-22 MED ORDER — BENZOCAINE-MENTHOL 20-0.5 % EX AERO: 1.0000 "application " | INHALATION_SPRAY | CUTANEOUS | Status: DC | PRN

## 2017-01-22 MED ORDER — DIPHENHYDRAMINE HCL 25 MG PO CAPS: 25.0000 mg | ORAL_CAPSULE | Freq: Four times a day (QID) | ORAL | Status: DC | PRN

## 2017-01-22 NOTE — Plan of Care (Signed)
Problem: Coping: Goal: Ability to cope will improve Outcome: Progressing Patient has a flat affect and engages in minimal conversation but is taking care of the baby appropriately.

## 2017-01-22 NOTE — Progress Notes (Signed)
Post Partum Day 1 Subjective: up ad lib, voiding, tolerating PO, + flatus and mild abdominal pain controlled with ibuprofen.  Objective: Blood pressure 111/67, pulse (!) 57, temperature 98.4 F (36.9 C), temperature source Oral, resp. rate 18, height 5\' 6"  (1.676 m), weight 71.7 kg (158 lb), last menstrual period 04/14/2016, SpO2 100 %, unknown if currently breastfeeding.  Physical Exam:  General: alert and cooperative Lochia: appropriate Uterine Fundus: firm Incision: N/A DVT Evaluation: No evidence of DVT seen on physical exam.   Recent Labs  01/21/17 1520  HGB 10.8*  HCT 32.8*    Assessment/Plan: Plan for discharge tomorrow, Breastfeeding and Contraception method: Depo Provera   LOS: 1 day   Julieanne MansonRebecca Ka Flammer, PA-S 01/22/2017, 7:20 AM

## 2017-01-23 ENCOUNTER — Encounter: Payer: Self-pay | Admitting: Family Medicine

## 2017-01-23 MED ORDER — WITCH HAZEL-GLYCERIN EX PADS: 1.0000 "application " | MEDICATED_PAD | CUTANEOUS | Status: DC | PRN

## 2017-01-23 MED ORDER — SENNOSIDES-DOCUSATE SODIUM 8.6-50 MG PO TABS: 2.0000 | ORAL_TABLET | ORAL | Status: DC

## 2017-01-23 MED ORDER — IBUPROFEN 600 MG PO TABS
600.0000 mg | ORAL_TABLET | Freq: Four times a day (QID) | ORAL | Status: DC
Start: 2017-01-23 — End: 2017-01-23

## 2017-01-23 MED ORDER — PRENATAL MULTIVITAMIN CH
1.0000 | ORAL_TABLET | Freq: Every day | ORAL | Status: DC
Start: 2017-01-23 — End: 2017-01-23

## 2017-01-23 MED ORDER — COCONUT OIL OIL: 1.0000 "application " | TOPICAL_OIL | Status: DC | PRN

## 2017-01-23 MED ORDER — ONDANSETRON HCL 4 MG PO TABS: 4.0000 mg | ORAL_TABLET | ORAL | Status: DC | PRN

## 2017-01-23 MED ORDER — SENNOSIDES-DOCUSATE SODIUM 8.6-50 MG PO TABS: 2.0000 | ORAL_TABLET | ORAL | 0 refills | Status: AC

## 2017-01-23 MED ORDER — TETANUS-DIPHTH-ACELL PERTUSSIS 5-2.5-18.5 LF-MCG/0.5 IM SUSP: 0.5000 mL | Freq: Once | INTRAMUSCULAR | Status: DC

## 2017-01-23 MED ORDER — ZOLPIDEM TARTRATE 5 MG PO TABS: 5.0000 mg | ORAL_TABLET | Freq: Every evening | ORAL | Status: DC | PRN

## 2017-01-23 MED ORDER — IBUPROFEN 600 MG PO TABS: 600.0000 mg | ORAL_TABLET | Freq: Four times a day (QID) | ORAL | 0 refills | Status: AC

## 2017-01-23 MED ORDER — ACETAMINOPHEN 325 MG PO TABS: 650.0000 mg | ORAL_TABLET | ORAL | Status: DC | PRN

## 2017-01-23 MED ORDER — ONDANSETRON HCL 4 MG/2ML IJ SOLN: 4.0000 mg | INTRAMUSCULAR | Status: DC | PRN

## 2017-01-23 MED ORDER — BENZOCAINE-MENTHOL 20-0.5 % EX AERO: 1.0000 "application " | INHALATION_SPRAY | CUTANEOUS | Status: DC | PRN

## 2017-01-23 MED ORDER — SIMETHICONE 80 MG PO CHEW: 80.0000 mg | CHEWABLE_TABLET | ORAL | Status: DC | PRN

## 2017-01-23 MED ORDER — DIPHENHYDRAMINE HCL 25 MG PO CAPS: 25.0000 mg | ORAL_CAPSULE | Freq: Four times a day (QID) | ORAL | Status: DC | PRN

## 2017-01-23 MED ORDER — DIBUCAINE 1 % RE OINT
1.0000 | TOPICAL_OINTMENT | RECTAL | Status: DC | PRN
Start: 2017-01-23 — End: 2017-01-23

## 2017-01-23 NOTE — Discharge Summary (Signed)
OB Discharge Summary     Patient Name: Heather FeeMelissa Cohen DOB: 01/09/1991 MRN: [id number]  Date of admission: 01/21/2017 Delivering MD: Suella BroadMINOTT, KERIANN S   Date of discharge: 01/23/2017  Admitting diagnosis: LEAKING Intrauterine pregnancy: 9733w1d     Secondary diagnosis:  Active Problems:   Normal labor   SVD (spontaneous vaginal delivery)  Additional problems: none     Discharge diagnosis: Term Pregnancy Delivered                                                                                                Post partum procedures:none  Augmentation: none  Complications: None  Hospital course:  Onset of Labor With Vaginal Delivery     26 y.o. yo [id number] at 5733w1d was admitted in Latent Labor on 01/21/2017. Patient had an uncomplicated labor course as follows:  Membrane Rupture Time/Date: 2:30 PM ,01/21/2017   Intrapartum Procedures: Episiotomy: None [1]                                         Lacerations:  None [1]  Patient had a delivery of a Viable infant. 01/21/2017  Information for the patient's newborn:  Heather Cohen, Girl Juleah [[id number]][[id number]]  Delivery Method: Vaginal, Spontaneous Delivery (Filed from Delivery Summary)    Pateint had an uncomplicated postpartum course.  She is ambulating, tolerating a regular diet, passing flatus, and urinating well. Patient is discharged home in stable condition on 01/23/17.   Physical exam  Vitals:   01/22/17 0604 01/22/17 1420 01/22/17 1827 01/23/17 0535  BP: 111/67 102/63 114/70 124/85  Pulse: (!) 57 (!) 58 (!) 58 (!) 58  Resp: 18 18 17 18   Temp: 98.4 F (36.9 C) 98.7 F (37.1 C) 97.9 F (36.6 C) 98.2 F (36.8 C)  TempSrc: Oral Oral Oral Oral  SpO2: 100%   100%  Weight:      Height:       General: alert, cooperative and no distress Lochia: appropriate Uterine Fundus: firm Incision: N/A DVT Evaluation: No evidence of DVT seen on physical exam. Labs: Lab Results  Component Value Date   WBC 7.5 01/21/2017   HGB 10.8 (L)  01/21/2017   HCT 32.8 (L) 01/21/2017   MCV 83.5 01/21/2017   PLT 217 01/21/2017   CMP 10/11/2016  Glucose 82    Discharge instruction: per After Visit Summary and "Baby and Me Booklet".  After visit meds:  Allergies as of 01/23/2017   No Known Allergies     Medication List    TAKE these medications   ibuprofen 600 MG tablet Commonly known as:  ADVIL,MOTRIN Take 1 tablet (600 mg total) by mouth every 6 (six) hours.   Prenatal Vitamins 0.8 MG tablet Take 1 tablet by mouth daily.   senna-docusate 8.6-50 MG tablet Commonly known as:  Senokot-S Take 2 tablets by mouth daily.       Diet: routine diet  Activity: Advance as tolerated. Pelvic rest for 6 weeks.   Outpatient follow up:4 weeks Follow up Appt:No future  appointments. Follow up Visit:No Follow-up on file.  Postpartum contraception: Depo Provera  Newborn Data: Live born female  Birth Weight: 7 lb 5.8 oz (3340 g) APGAR: 9, 9  Newborn Delivery   Birth date/time:  01/21/2017 22:57:00 Delivery type:  Vaginal, Spontaneous Delivery      Baby Feeding: Bottle Disposition:home with mother   01/23/2017 Suella Broad, MD   I spoke with and examined patient and agree with resident/PA/SNM's note and plan of care. CRESENZO-DISHMAN,Latera Mclin  01/28/2017 12:21 PM

## 2017-01-23 NOTE — Progress Notes (Signed)
CSW received consult for "flat affect."  No concerns noted in patient's hx. CSW spoke with bedside RN who states MOB has been caring for her infant appropriately as well as conversant with RN today.  RN identifies no concerns today.  CSW asked to be contacted if current concerns arise prior to discharge and is screening out consult at this time.

## 2017-01-23 NOTE — Discharge Instructions (Signed)

## 2017-01-27 ENCOUNTER — Inpatient Hospital Stay (HOSPITAL_COMMUNITY): Admission: RE | Admit: 2017-01-27 | Payer: Self-pay | Source: Ambulatory Visit

## 2017-03-05 ENCOUNTER — Ambulatory Visit: Payer: Self-pay | Admitting: Student

## 2017-09-14 ENCOUNTER — Encounter (HOSPITAL_COMMUNITY): Payer: Self-pay | Admitting: *Deleted

## 2017-09-14 ENCOUNTER — Emergency Department (HOSPITAL_COMMUNITY)
Admission: EM | Admit: 2017-09-14 | Discharge: 2017-09-14 | Disposition: A | Discharge status: Home / Self Care | Payer: Medicaid - Out of State | Attending: Emergency Medicine | Admitting: Emergency Medicine

## 2017-09-14 DIAGNOSIS — N390 Urinary tract infection, site not specified: Secondary | ICD-10-CM | POA: Diagnosis not present

## 2017-09-14 DIAGNOSIS — R102 Pelvic and perineal pain: Secondary | ICD-10-CM | POA: Diagnosis present

## 2017-09-14 DIAGNOSIS — Z87891 Personal history of nicotine dependence: Secondary | ICD-10-CM | POA: Diagnosis not present

## 2017-09-14 DIAGNOSIS — Z79899 Other long term (current) drug therapy: Secondary | ICD-10-CM | POA: Insufficient documentation

## 2017-09-14 DIAGNOSIS — A599 Trichomoniasis, unspecified: Secondary | ICD-10-CM | POA: Diagnosis not present

## 2017-09-14 LAB — CBC
HCT: 41.1 % (ref 36.0–46.0)
Hemoglobin: 12.7 g/dL (ref 12.0–15.0)
MCH: 27 pg (ref 26.0–34.0)
MCHC: 30.9 g/dL (ref 30.0–36.0)
MCV: 87.3 fL (ref 78.0–100.0)
PLATELETS: 310 10*3/uL (ref 150–400)
RBC: 4.71 MIL/uL (ref 3.87–5.11)
RDW: 13.1 % (ref 11.5–15.5)
WBC: 5.9 10*3/uL (ref 4.0–10.5)

## 2017-09-14 LAB — WET PREP, GENITAL
Clue Cells Wet Prep HPF POC: NONE SEEN
Sperm: NONE SEEN
Yeast Wet Prep HPF POC: NONE SEEN

## 2017-09-14 LAB — I-STAT BETA HCG BLOOD, ED (MC, WL, AP ONLY): I-stat hCG, quantitative: <5 m[IU]/mL (ref <5)

## 2017-09-14 LAB — URINALYSIS, ROUTINE W REFLEX MICROSCOPIC
Bilirubin Urine: NEGATIVE
GLUCOSE, UA: NEGATIVE mg/dL
Ketones, ur: NEGATIVE mg/dL
NITRITE: POSITIVE — AB
PH: 5 (ref 5.0–8.0)
Protein, ur: NEGATIVE mg/dL
SPECIFIC GRAVITY, URINE: 1.026 (ref 1.005–1.030)
WBC, UA: >50 WBC/hpf — ABNORMAL HIGH (ref 0–5)

## 2017-09-14 LAB — BASIC METABOLIC PANEL
ANION GAP: 9 (ref 5–15)
BUN: 11 mg/dL (ref 6–20)
CO2: 19 mmol/L — ABNORMAL LOW (ref 22–32)
Calcium: 9 mg/dL (ref 8.9–10.3)
Chloride: 110 mmol/L (ref 101–111)
Creatinine, Ser: 1.02 mg/dL — ABNORMAL HIGH (ref 0.44–1.00)
GLUCOSE: 86 mg/dL (ref 65–99)
POTASSIUM: 4.4 mmol/L (ref 3.5–5.1)
Sodium: 138 mmol/L (ref 135–145)

## 2017-09-14 MED ORDER — LIDOCAINE HCL (PF) 1 % IJ SOLN
INTRAMUSCULAR | Status: AC
  Filled 2017-09-14: qty 5

## 2017-09-14 MED ORDER — CEFTRIAXONE SODIUM 250 MG IJ SOLR
250.0000 mg | Freq: Once | INTRAMUSCULAR | Status: AC
  Administered 2017-09-14: 250 mg via INTRAMUSCULAR
  Filled 2017-09-14: qty 250

## 2017-09-14 MED ORDER — AZITHROMYCIN 250 MG PO TABS
1000.0000 mg | ORAL_TABLET | Freq: Once | ORAL | Status: AC
  Administered 2017-09-14: 1000 mg via ORAL
  Filled 2017-09-14: qty 4

## 2017-09-14 MED ORDER — METRONIDAZOLE 500 MG PO TABS: 500.0000 mg | ORAL_TABLET | Freq: Two times a day (BID) | ORAL | 0 refills | Status: AC

## 2017-09-14 MED ORDER — CEPHALEXIN 500 MG PO CAPS: 500.0000 mg | ORAL_CAPSULE | Freq: Two times a day (BID) | ORAL | 0 refills | Status: AC

## 2017-09-14 NOTE — ED Provider Notes (Signed)
[facility] EMERGENCY DEPARTMENT Provider Note   CSN: [id number] Arrival date & time: 09/14/17  1209   History   Chief Complaint Chief Complaint  Patient presents with  . Vaginal Discharge  . Flank Pain    HPI Heather Cohen is a 27 y.o. female.  HPI    28 year old female presents today with complaints of vaginal discharge and pelvic pain.  Patient notes a week long history of purulent vaginal discharge and pain with sex.  She denies any vaginal bleeding, denies any pain with palpation of the abdomen upper abdominal pain nausea vomiting or fever.  Patient notes she is sexually active with one female partner, but is uncertain if he is sexually active with other females.  Patient notes that she is also having urinary frequency, but denies any hematuria or change in the color or clarity of her urine.  She notes the pelvic pain sometimes radiates around to her lower back.   Past Medical History:  Diagnosis Date  . Medical history non-contributory     Patient Active Problem List   Diagnosis Date Noted  . Normal labor 01/21/2017  . SVD (spontaneous vaginal delivery) 01/21/2017  . NST (non-stress test) reactive 01/19/2017  . Supervision of high risk pregnancy, antepartum 12/26/2016  . History of preterm delivery, currently pregnant 12/26/2016    Past Surgical History:  Procedure Laterality Date  . DILATION AND CURETTAGE OF UTERUS     2011  . HERNIA REPAIR       OB History    Gravida  6   Para  6   Term  4   Preterm  2   AB      Living  6     SAB      TAB      Ectopic      Multiple  0   Live Births  6            Home Medications    Prior to Admission medications   Medication Sig Start Date End Date Taking? Authorizing Provider  cephALEXin (KEFLEX) 500 MG capsule Take 1 capsule (500 mg total) by mouth 2 (two) times daily. 09/14/17   Sylvie Mifsud, Tinnie Gens, PA-C  ibuprofen (ADVIL,MOTRIN) 600 MG tablet Take 1 tablet (600 mg total) by mouth  every 6 (six) hours. 01/23/17   Minott, Lynnae Prude, MD  metroNIDAZOLE (FLAGYL) 500 MG tablet Take 1 tablet (500 mg total) by mouth 2 (two) times daily. 09/14/17   Kentaro Alewine, Tinnie Gens, PA-C  Prenatal Multivit-Min-Fe-FA (PRENATAL VITAMINS) 0.8 MG tablet Take 1 tablet by mouth daily. 12/18/16   Constant, Peggy, MD  senna-docusate (SENOKOT-S) 8.6-50 MG tablet Take 2 tablets by mouth daily. 01/24/17   Minott, Lynnae Prude, MD    Family History History reviewed. No pertinent family history.  Social History Social History   Tobacco Use  . Smoking status: Former Smoker    Packs/day: 0.25    Years: 5.00    Pack years: 1.25    Types: Cigarettes    Last attempt to quit: 03/2014    Years since quitting: 3.4  . Smokeless tobacco: Never Used  Substance Use Topics  . Alcohol use: No  . Drug use: No     Allergies   Patient has no known allergies.   Review of Systems Review of Systems  All other systems reviewed and are negative.  Physical Exam Updated Vital Signs BP 108/60 (BP Location: Right Arm)   Pulse 68   Temp 98 F (36.7 C) (  Oral)   Resp 16   LMP 08/14/2017   SpO2 100%    Physical Exam  Constitutional: She is oriented to person, place, and time. She appears well-developed and well-nourished.  HENT:  Head: Normocephalic and atraumatic.  Eyes: Pupils are equal, round, and reactive to light. Conjunctivae are normal. Right eye exhibits no discharge. Left eye exhibits no discharge. No scleral icterus.  Neck: Normal range of motion. No JVD present. No tracheal deviation present.  Pulmonary/Chest: Effort normal. No stridor.  Abdominal: Soft. She exhibits no distension and no mass. There is no tenderness. There is no rebound and no guarding. No hernia.  No CVA tenderness  Neurological: She is alert and oriented to person, place, and time. Coordination normal.  Psychiatric: She has a normal mood and affect. Her behavior is normal. Judgment and thought content normal.  Nursing note and  vitals reviewed.   ED Treatments / Results  Labs (all labs ordered are listed, but only abnormal results are displayed) Labs Reviewed  WET PREP, GENITAL - Abnormal; Notable for the following components:      Result Value   Trich, Wet Prep PRESENT (*)    WBC, Wet Prep HPF POC MANY (*)    All other components within normal limits  BASIC METABOLIC PANEL - Abnormal; Notable for the following components:   CO2 19 (*)    Creatinine, Ser 1.02 (*)    All other components within normal limits  URINALYSIS, ROUTINE W REFLEX MICROSCOPIC - Abnormal; Notable for the following components:   APPearance HAZY (*)    Hgb urine dipstick SMALL (*)    Nitrite POSITIVE (*)    Leukocytes, UA LARGE (*)    WBC, UA >50 (*)    Bacteria, UA MANY (*)    Non Squamous Epithelial 0-5 (*)    All other components within normal limits  CBC  URINALYSIS, ROUTINE W REFLEX MICROSCOPIC  HIV ANTIBODY (ROUTINE TESTING)  RPR  I-STAT BETA HCG BLOOD, ED ([facility], [facility], [facility] ONLY)  GC/CHLAMYDIA PROBE AMP ([place]) NOT AT [facility]    EKG None  Radiology No results found.  Procedures Procedures (including critical care time)  Medications Ordered in ED Medications  lidocaine (PF) (XYLOCAINE) 1 % injection (has no administration in time range)  cefTRIAXone (ROCEPHIN) injection 250 mg (250 mg Intramuscular Given 09/14/17 1640)  azithromycin (ZITHROMAX) tablet 1,000 mg (1,000 mg Oral Given 09/14/17 1640)     Initial Impression / Assessment and Plan / ED Course  I have reviewed the triage vital signs and the nursing notes.  Pertinent labs & imaging results that were available during my care of the patient were reviewed by me and considered in my medical decision making (see chart for details).     Labs:  I- stat beta hcg, cbc, bmp  Imaging:  Consults:  Therapeutics:  Discharge Meds:   Assessment/Plan:  65 YOF presents today with complaint of vaginal discharge.  Patient with trichomoniasis, treated for STDs.   Patient also with increased urinary frequency with nitrite positive urine, she will also be treated for urinary tract infection.  Patient has no signs of PID, no systemic symptoms.  Discharged with outpatient follow-up strict return precautions.  She verbalized understanding and agreement to today's plan had no further questions or concerns at time discharge.   Final Clinical Impressions(s) / ED Diagnoses   Final diagnoses:  Trichimoniasis  Lower urinary tract infectious disease    ED Discharge Orders        Ordered  metroNIDAZOLE (FLAGYL) 500 MG tablet  2 times daily     09/14/17 1704    cephALEXin (KEFLEX) 500 MG capsule  2 times daily     09/14/17 1704       HedgesTinnie Gens, Mackinzee Roszak, PA-C 09/14/17 1812    Tilden Fossaees, Elizabeth, MD 09/15/17 1147

## 2017-09-14 NOTE — ED Notes (Signed)
Patient refused blood draw.

## 2017-09-14 NOTE — Discharge Instructions (Addendum)
Please read attached information. If you experience any new or worsening signs or symptoms please return to the emergency room for evaluation. Please follow-up with your primary care provider or specialist as discussed. Please use medication prescribed only as directed and discontinue taking if you have any concerning signs or symptoms.   °

## 2017-09-14 NOTE — ED Triage Notes (Signed)
Pt in c/o right flank pain and vaginal discharge for the last week, denies n/v, no distress noted

## 2017-09-15 LAB — GC/CHLAMYDIA PROBE AMP (~~LOC~~) NOT AT ARMC
Chlamydia: POSITIVE — AB
Neisseria Gonorrhea: NEGATIVE

## 2018-01-21 IMAGING — US US MFM OB COMP +14 WKS
1 series · 14 of 28 positions shown · non-contrast
Comparison: none

[Series 1: us mfm ob comp +14 wks · 69 acquisitions, 14 frames shown]
[im 3/69]
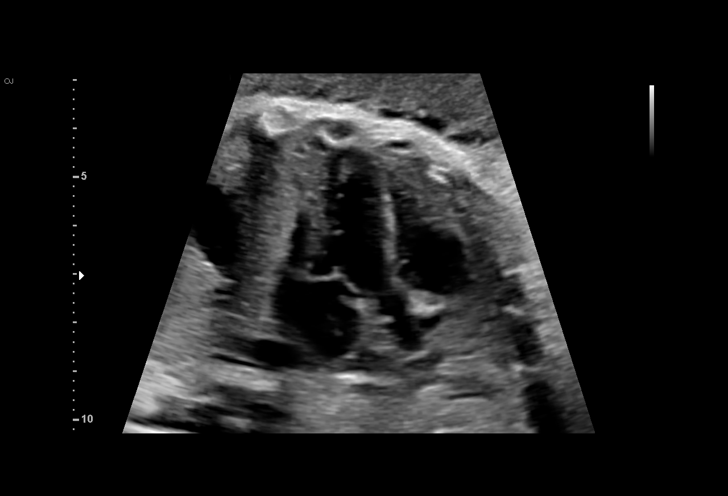
[im 8/69]
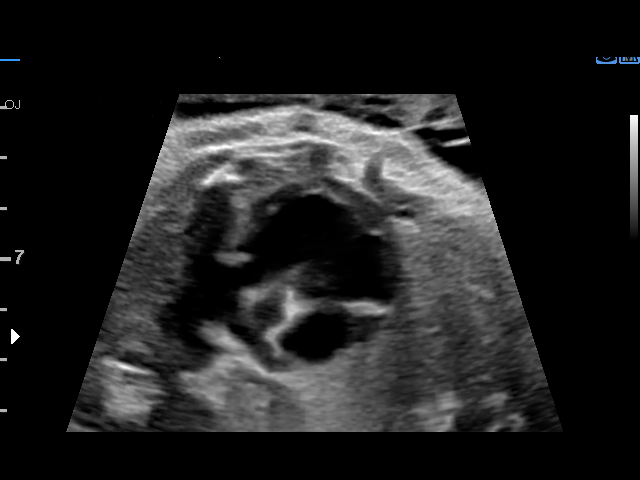
[im 13/69]
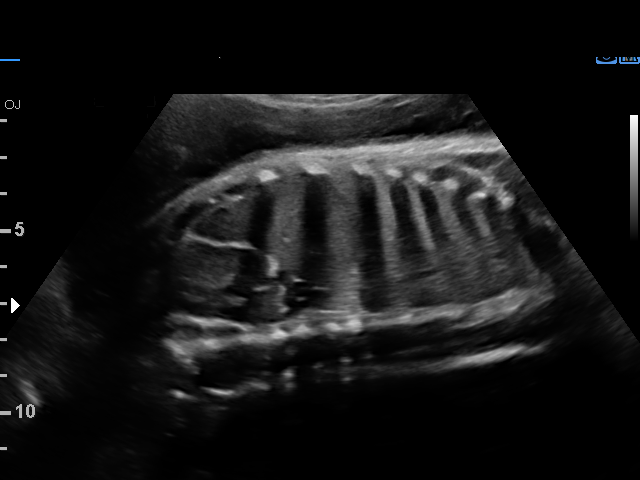
[im 18/69]
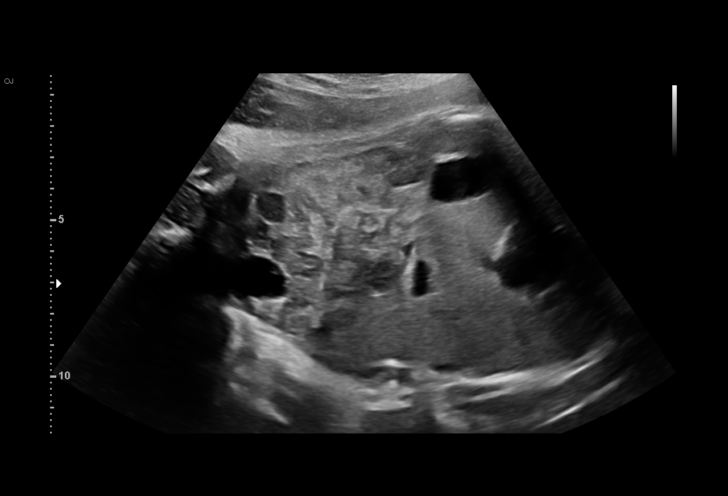
[im 23/69]
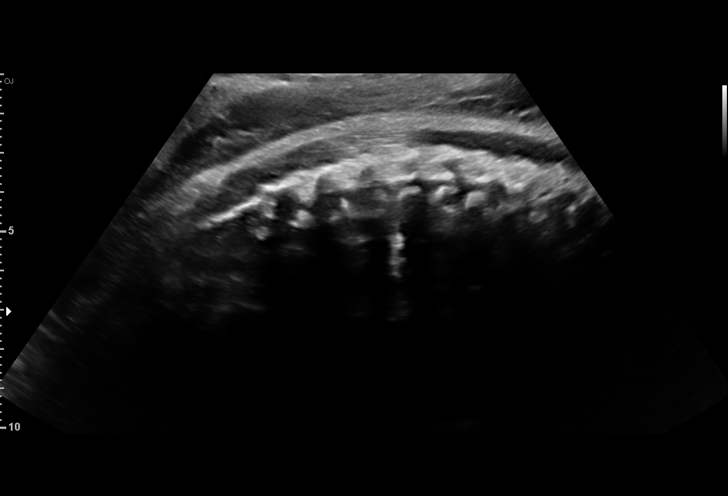
[im 28/69]
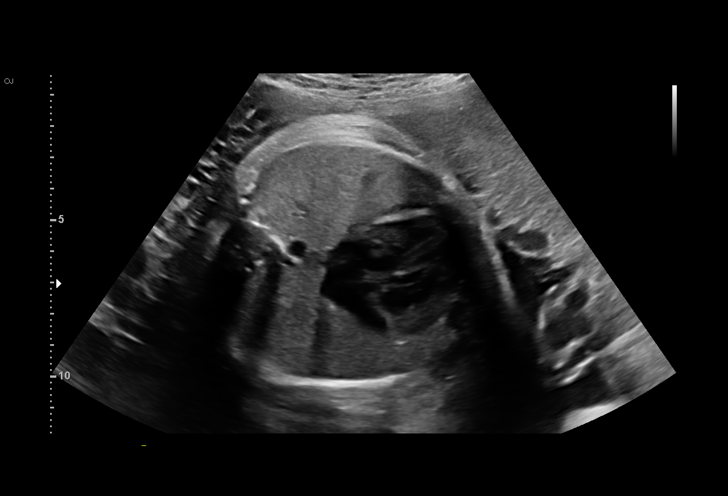
[im 33/69]
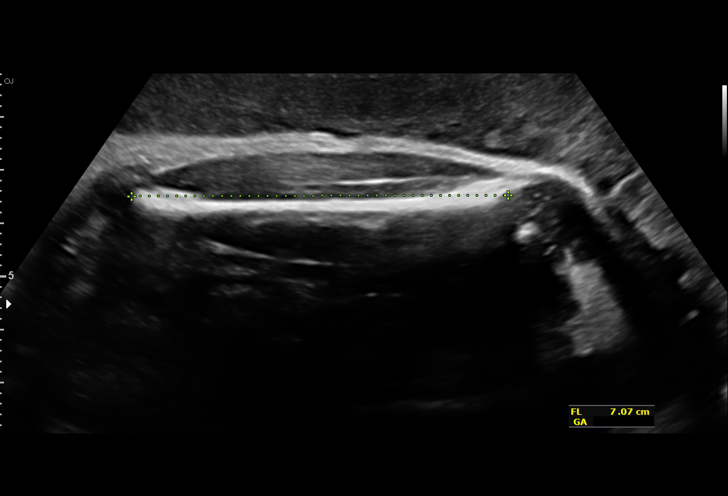
[im 38/69]
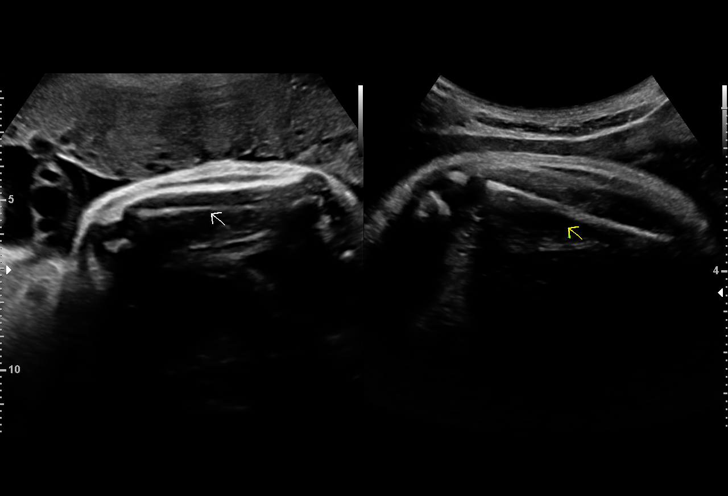
[im 43/69]
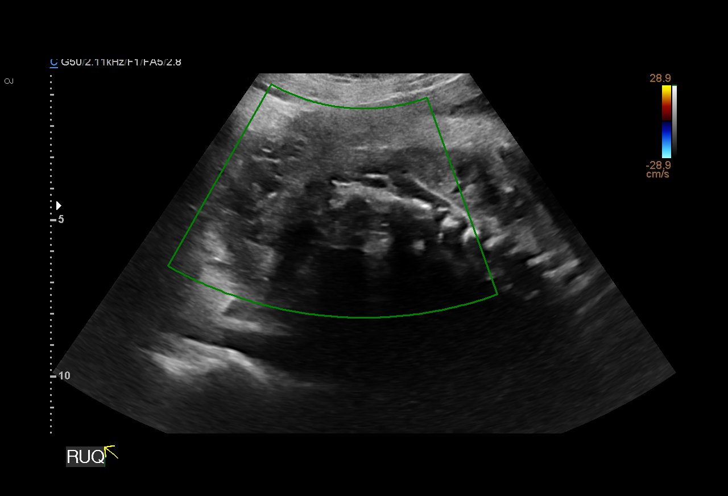
[im 48/69]
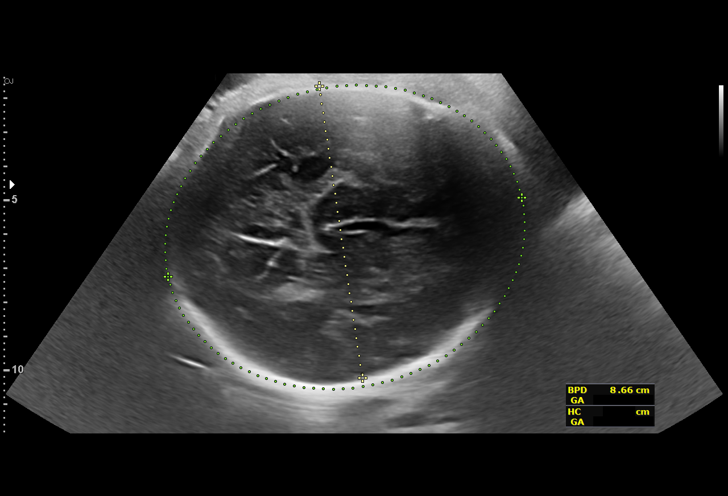
[im 53/69]
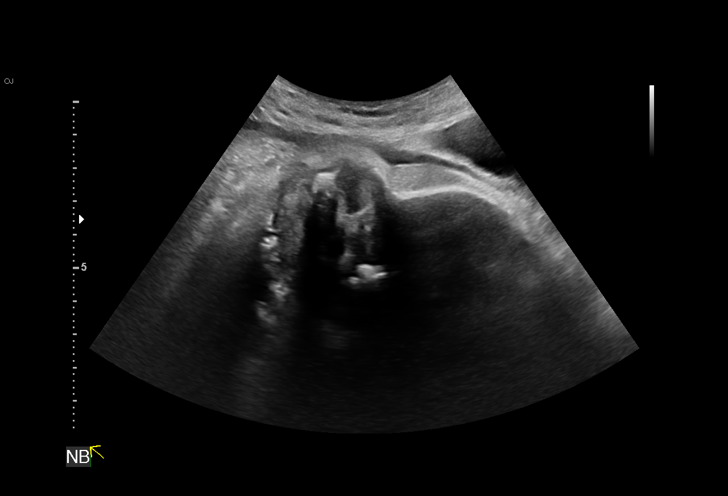
[im 58/69]
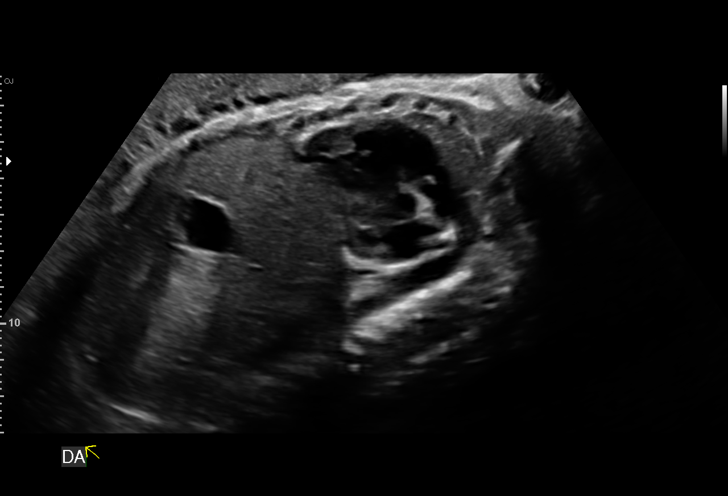
[im 63/69]
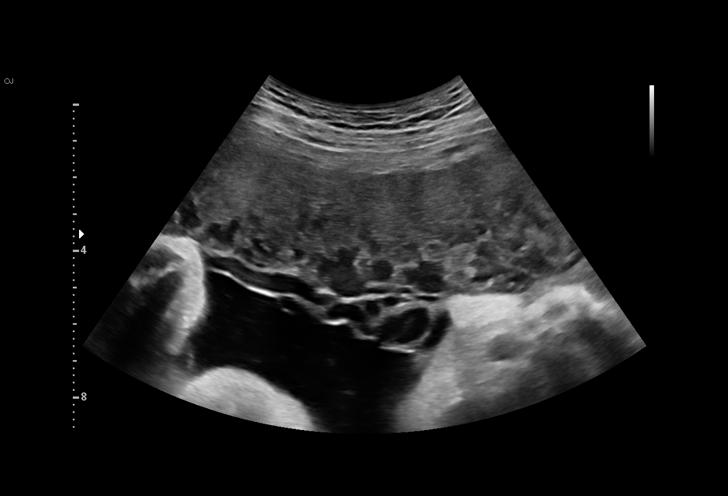
[im 69/69]
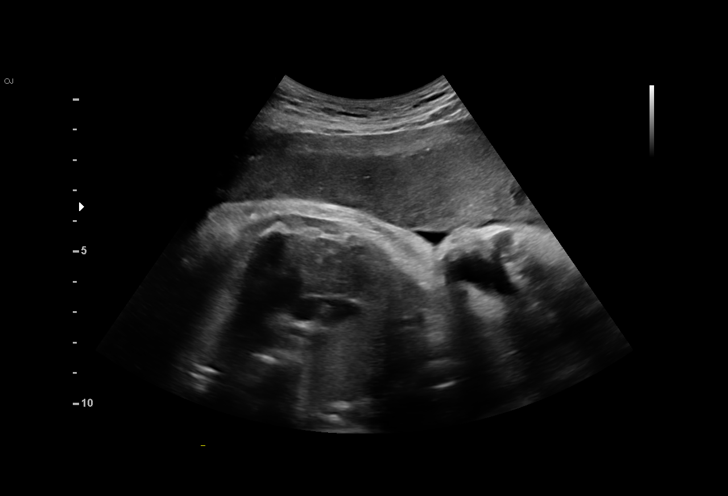

[14 of 28 positions shown; findings below may reference images not displayed]

1  NILS SHAKIR            [id number]      [id number]     [id number]
Indications

36 weeks gestation of pregnancy
Encounter for antenatal screening for
malformations
OB History

Gravidity:    6         Term:   3        Prem:   2        SAB:   0
TOP:          0       Ectopic:  0        Living: 5
Fetal Evaluation

Num Of Fetuses:     1
Fetal Heart         142
Rate(bpm):
Cardiac Activity:   Observed
Presentation:       Cephalic
Placenta:           Anterior, above cervical os
P. Cord Insertion:  Visualized

Amniotic Fluid
AFI FV:      Subjectively within normal limits

AFI Sum(cm)     %Tile       Largest Pocket(cm)
10.28           26

RUQ(cm)                     LUQ(cm)        LLQ(cm)
3.55
Biometry

BPD:      86.8  mm     G. Age:  35w 0d         16  %    CI:        81.44   %   70 - 86
FL/HC:      23.4   %   20.8 -
HC:      303.6  mm     G. Age:  33w 5d        < 3  %    HC/AC:      0.92       0.92 -
AC:      328.7  mm     G. Age:  36w 5d         61  %    FL/BPD:     81.8   %   71 - 87
FL:         71  mm     G. Age:  36w 3d         37  %    FL/AC:      21.6   %   20 - 24
HUM:      59.9  mm     G. Age:  34w 6d         29  %

Est. FW:    0798  gm      6 lb 5 oz     52  %
Gestational Age

Clinical EDD:  36w 6d                                        EDD:   01/20/17
U/S Today:     35w 3d                                        EDD:   01/30/17
Best:          36w 6d    Det. By:   Clinical EDD             EDD:   01/20/17
Anatomy

Cranium:               Appears normal         Aortic Arch:            Appears normal
Cavum:                 Not well visualized    Ductal Arch:            Appears normal
Ventricles:            Appears normal         Diaphragm:              Appears normal
Choroid Plexus:        Not well visualized    Stomach:                Appears normal, left
sided
Cerebellum:            Not well visualized    Abdomen:                Appears normal
Posterior Fossa:       Not well visualized    Abdominal Wall:         Not well visualized
Nuchal Fold:           Not applicable (>20    Cord Vessels:           Appears normal (3
wks GA)                                        vessel cord)
Face:                  Orbits nl; profile not Kidneys:                Appear normal
well visualized
Lips:                  Appears normal         Bladder:                Appears normal
Thoracic:              Appears normal         Spine:                  Limited views
appear normal
Heart:                 Appears normal         Upper Extremities:      Appears normal
(4CH, axis, and situs
RVOT:                  Appears normal         Lower Extremities:      Appears normal
LVOT:                  Appears normal

Other:  Fetus appears to be a female. Technically difficult due to advanced
GA and fetal position.
Cervix Uterus Adnexa

Cervix
Not visualized (advanced GA >95wks)

Uterus
No abnormality visualized.

Left Ovary
Size(cm)        3  x    2.5    x  1.2       Vol(ml):
Within normal limits.

Right Ovary
Size(cm)       3.5 x    2.2    x  2.3       Vol(ml):
Within normal limits.

Cul De Sac:   No free fluid seen.

Adnexa:       No abnormality visualized.
Impression

SIUP at 36+6 weeks
Cephalic presentation
Normal detailed fetal anatomy; limited views of intracranial
anatomy, profile, CI and spine
Normal amniotic fluid volume
Measurements consistent with stated EDC; EFW at the 52nd
%tile
Recommendations

Follow-up as clinically indicated

## 2018-03-28 ENCOUNTER — Encounter (HOSPITAL_COMMUNITY): Payer: Self-pay | Admitting: *Deleted

## 2018-03-28 ENCOUNTER — Other Ambulatory Visit: Payer: Self-pay

## 2018-03-28 ENCOUNTER — Emergency Department (HOSPITAL_COMMUNITY)
Admission: EM | Admit: 2018-03-28 | Discharge: 2018-03-29 | Disposition: A | Discharge status: Home / Self Care | Payer: Medicaid - Out of State | Attending: Emergency Medicine | Admitting: Emergency Medicine

## 2018-03-28 DIAGNOSIS — R1031 Right lower quadrant pain: Secondary | ICD-10-CM | POA: Diagnosis present

## 2018-03-28 DIAGNOSIS — Z79899 Other long term (current) drug therapy: Secondary | ICD-10-CM | POA: Diagnosis not present

## 2018-03-28 DIAGNOSIS — N898 Other specified noninflammatory disorders of vagina: Secondary | ICD-10-CM | POA: Diagnosis not present

## 2018-03-28 DIAGNOSIS — N888 Other specified noninflammatory disorders of cervix uteri: Secondary | ICD-10-CM

## 2018-03-28 DIAGNOSIS — Z87891 Personal history of nicotine dependence: Secondary | ICD-10-CM | POA: Diagnosis not present

## 2018-03-28 NOTE — ED Triage Notes (Signed)
vaghinal discharge for a few weeks  She is also c/o rt abd pain since yesterday  lmp  She reports that she does not know when her last period was  She has just been spotting

## 2018-03-28 NOTE — ED Provider Notes (Signed)
[facility]ERGENC[facility]PART[facility] Provider Note   CSN: [id number] Arrival date & time: 03/28/18  2235     History   Chief Complaint Chief Complaint  Patient presents with  . Abdominal Pain    HPI Heather Cohen is a 27 y.o. female who complains of pain in the right lower quadrant and flank for the past several days.  She has had about 2 weeks of associated vaginal discharge which has associated foul odor.  She denies any burning with urination, pain with intercourse, fevers chills nausea vomiting or diarrhea.  He is unsure of her last menstrual period.Marland Kitchen.  HPI  Past Medical History:  Diagnosis Date  . Medical history non-contributory     Patient Active Problem List   Diagnosis Date Noted  . Normal labor 01/21/2017  . SVD (spontaneous vaginal delivery) 01/21/2017  . NST (non-stress test) reactive 01/19/2017  . Supervision of high risk pregnancy, antepartum 12/26/2016  . History of preterm delivery, currently pregnant 12/26/2016    Past Surgical History:  Procedure Laterality Date  . DILATION AND CURETTAGE OF UTERUS     2011  . HERNIA REPAIR       OB History    Gravida  6   Para  6   Term  4   Preterm  2   AB      Living  6     SAB      TAB      Ectopic      Multiple  0   Live Births  6            Home Medications    Prior to Admission medications   Medication Sig Start Date End Date Taking? Authorizing Provider  cephALEXin (KEFLEX) 500 MG capsule Take 1 capsule (500 mg total) by mouth 2 (two) times daily. 09/14/17   Hedges, Tinnie GensJeffrey, PA-C  ibuprofen (ADVIL,MOTRIN) 600 MG tablet Take 1 tablet (600 mg total) by mouth every 6 (six) hours. 01/23/17   Minott, Lynnae PrudeKeriann S, MD  metroNIDAZOLE (FLAGYL) 500 MG tablet Take 1 tablet (500 mg total) by mouth 2 (two) times daily. 09/14/17   Hedges, Tinnie GensJeffrey, PA-C  Prenatal Multivit-Min-Fe-FA (PRENATAL VITAMINS) 0.8 MG tablet Take 1 tablet by mouth daily. 12/18/16   Constant, Peggy, MD    senna-docusate (SENOKOT-S) 8.6-50 MG tablet Take 2 tablets by mouth daily. 01/24/17   Minott, Lynnae PrudeKeriann S, MD    Family History No family history on file.  Social History Social History   Tobacco Use  . Smoking status: Former Smoker    Packs/day: 0.25    Years: 5.00    Pack years: 1.25    Types: Cigarettes    Last attempt to quit: 03/2014    Years since quitting: 3.9  . Smokeless tobacco: Never Used  Substance Use Topics  . Alcohol use: Yes  . Drug use: No     Allergies   Patient has no known allergies.   Review of Systems Review of Systems   Physical Exam Updated Vital Signs BP 120/76 (BP Location: Right Arm)   Pulse 61   Temp 98 F (36.7 C) (Oral)   Resp 16   Ht 5\' 7"  (1.702 m)   Wt 65.8 kg   SpO2 100%   BMI 22.71 kg/m   Physical Exam Vitals signs and nursing note reviewed.  Constitutional:      General: She is not in acute distress.    Appearance: She is well-developed. She is not diaphoretic.  HENT:  Head: Normocephalic and atraumatic.  Eyes:     General: No scleral icterus.    Conjunctiva/sclera: Conjunctivae normal.  Neck:     Musculoskeletal: Normal range of motion.  Cardiovascular:     Rate and Rhythm: Normal rate and regular rhythm.     Heart sounds: Normal heart sounds. No murmur. No friction rub. No gallop.   Pulmonary:     Effort: Pulmonary effort is normal. No respiratory distress.     Breath sounds: Normal breath sounds.  Abdominal:     General: Bowel sounds are normal. There is no distension.     Palpations: Abdomen is soft. There is no mass.     Tenderness: There is no abdominal tenderness. There is no guarding.  Genitourinary:    Comments: Pelvic exam: normal external genitalia, vulva, vagina, uterus and adnexa. Cervix with creamy yellow-green discharge and mucoid-bloody discharge. No CMT.  Skin:    General: Skin is warm and dry.  Neurological:     Mental Status: She is alert and oriented to person, place, and time.   Psychiatric:        Behavior: Behavior normal.      ED Treatments / Results  Labs (all labs ordered are listed, but only abnormal results are displayed) Labs Reviewed  WET PREP, GENITAL  RPR  HIV ANTIBODY (ROUTINE TESTING W REFLEX)  URINALYSIS, ROUTINE W REFLEX MICROSCOPIC  I-STAT BETA HCG BLOOD, ED ([facility], [facility], [facility] ONLY)  GC/CHLAMYDIA PROBE AMP ([place]) NOT AT [facility]    EKG None  Radiology No results found.  Procedures Procedures (including critical care time)  Medications Ordered in ED Medications - No data to display   Initial Impression / Assessment and Plan / ED Course  I have reviewed the triage vital signs and the nursing notes.  Pertinent labs & imaging results that were available during my care of the patient were reviewed by me and considered in my medical decision making (see chart for details).     27 year old female with benign abdominal and pelvic exam except for some irregular cervical discharge concerning for cervicitis.  Patient has a negative cervical motion test.  No tenderness in the adnexa.  Patient treated for STDs here with Rocephin and azithromycin.  I discussed safe sex practices and birth control with this patient.  Discussed return precautions.  No active signs of PID.  Negative pregnancy test.  Appears appropriate for discharge at this time  Final Clinical Impressions(s) / ED Diagnoses   Final diagnoses:  None    ED Discharge Orders    None       Arthor CaptainHarris, Shayde Gervacio, PA-C 03/29/18 [id number]    Zadie RhineWickline, Donald, MD 03/29/18 0600

## 2018-03-28 NOTE — ED Notes (Signed)
Ice pack to wrist

## 2018-03-29 ENCOUNTER — Other Ambulatory Visit: Payer: Self-pay

## 2018-03-29 LAB — URINALYSIS, ROUTINE W REFLEX MICROSCOPIC
BILIRUBIN URINE: NEGATIVE
Bacteria, UA: NONE SEEN
GLUCOSE, UA: NEGATIVE mg/dL
KETONES UR: NEGATIVE mg/dL
LEUKOCYTES UA: NEGATIVE
NITRITE: NEGATIVE
PH: 6 (ref 5.0–8.0)
Protein, ur: NEGATIVE mg/dL
SPECIFIC GRAVITY, URINE: 1.024 (ref 1.005–1.030)

## 2018-03-29 LAB — I-STAT BETA HCG BLOOD, ED (MC, WL, AP ONLY)

## 2018-03-29 LAB — GC/CHLAMYDIA PROBE AMP (~~LOC~~) NOT AT ARMC
Chlamydia: NEGATIVE
NEISSERIA GONORRHEA: NEGATIVE

## 2018-03-29 LAB — WET PREP, GENITAL
SPERM: NONE SEEN
Trich, Wet Prep: NONE SEEN
Yeast Wet Prep HPF POC: NONE SEEN

## 2018-03-29 LAB — RPR: RPR Ser Ql: NONREACTIVE

## 2018-03-29 LAB — HIV ANTIBODY (ROUTINE TESTING W REFLEX): HIV SCREEN 4TH GENERATION: NONREACTIVE

## 2018-03-29 MED ORDER — GENTAMICIN SULFATE 0.3 % OP SOLN: 2.0000 [drp] | OPHTHALMIC | 0 refills | Status: DC

## 2018-03-29 MED ORDER — LEVOCETIRIZINE DIHYDROCHLORIDE 5 MG PO TABS: 5.0000 mg | ORAL_TABLET | Freq: Every evening | ORAL | 0 refills | Status: DC

## 2018-03-29 MED ORDER — AZITHROMYCIN 250 MG PO TABS
1000.0000 mg | ORAL_TABLET | Freq: Once | ORAL | Status: AC
  Administered 2018-03-29: 1000 mg via ORAL
  Filled 2018-03-29: qty 4

## 2018-03-29 MED ORDER — CEFTRIAXONE SODIUM 250 MG IJ SOLR
250.0000 mg | Freq: Once | INTRAMUSCULAR | Status: AC
  Administered 2018-03-29: 250 mg via INTRAMUSCULAR
  Filled 2018-03-29: qty 250

## 2018-03-29 MED ORDER — LIDOCAINE HCL (PF) 1 % IJ SOLN
INTRAMUSCULAR | Status: AC
  Administered 2018-03-29: 1.9 mL
  Filled 2018-03-29: qty 5

## 2018-03-29 MED ORDER — KETOTIFEN FUMARATE 0.025 % OP SOLN: 1.0000 [drp] | Freq: Two times a day (BID) | OPHTHALMIC | 0 refills | Status: DC

## 2018-03-29 NOTE — Discharge Instructions (Signed)
Follow these instructions at home: Do not have sex until your health care provider says it is okay. Take over-the-counter and prescription medicines only as told by your health care provider. If you were prescribed an antibiotic, take it as told by your health care provider. Do not stop taking the antibiotic even if you start to feel better. Keep all follow-up visits as told by your health care provider. This is important. Contact a health care provider if: Your symptoms come back or get worse after treatment. You have a fever. You have fatigue. You have pain in your abdomen. You experience nausea, vomiting, or diarrhea. You have back pain. Get help right away if: You have severe abdominal pain that cannot be helped with medicine. You cannot urinate.  Free HIV and STD Testing These locations offer FREE confidential testing for HIV, Chlamydia, Gonorrhea, and Syphilis. Non-Traditional Testing Sites Address Telephone  [facility] [address], [place] [phone] Mondays 5pm - 7pm  [facility] [address] [place] [phone] Wednesdays 2pm-8pm  [facility] [address], [place] [phone] Thursdays 9am-12noon 1pm-4pm  [facility] [address], [place] [phone] Tuesdays Thursdays 9am-12noon 1pm-4pm  [facility] offers free, confidential testing and treatment for HIV, Chlamydia, Gonorrhea, Syphilis, Herpes, Bacterial Vaginosis, Yeast, and Trichomoniasis. Traditional Testing   [facility] [address], [place] [phone]  Monday thru Friday  Call for an appointment  [facility] [address] [place] [phone] Monday thru Friday  Call  for anappointment.  If you have any questions about this information please call [phone]. 02/06/2011\

## 2018-03-29 NOTE — ED Notes (Signed)
Patient verbalizes understanding of discharge instructions. Opportunity for questioning and answers were provided. Armband removed by staff, pt discharged from ED.
